# Patient Record
Sex: Female | Born: 1995 | Race: White | Hispanic: No | Marital: Single | Smoking: Former smoker
Health system: Southern US, Community
[De-identification: ages and names within clinical notes are randomized; demographics above are authoritative.]

## PROBLEM LIST (undated history)

## (undated) ENCOUNTER — Inpatient Hospital Stay (HOSPITAL_COMMUNITY): Payer: Self-pay

## (undated) DIAGNOSIS — D649 Anemia, unspecified: Secondary | ICD-10-CM

## (undated) DIAGNOSIS — R569 Unspecified convulsions: Secondary | ICD-10-CM

## (undated) DIAGNOSIS — J45909 Unspecified asthma, uncomplicated: Secondary | ICD-10-CM

## (undated) DIAGNOSIS — F419 Anxiety disorder, unspecified: Secondary | ICD-10-CM

## (undated) DIAGNOSIS — T7840XA Allergy, unspecified, initial encounter: Secondary | ICD-10-CM

## (undated) HISTORY — DX: Anemia, unspecified: D64.9

## (undated) HISTORY — PX: OTHER SURGICAL HISTORY: SHX169

## (undated) HISTORY — PX: APPENDECTOMY: SHX54

## (undated) HISTORY — PX: WISDOM TOOTH EXTRACTION: SHX21

## (undated) HISTORY — DX: Anxiety disorder, unspecified: F41.9

## (undated) HISTORY — DX: Allergy, unspecified, initial encounter: T78.40XA

---

## 2016-01-28 ENCOUNTER — Encounter (HOSPITAL_COMMUNITY): Payer: Self-pay | Admitting: Emergency Medicine

## 2016-01-28 ENCOUNTER — Encounter (HOSPITAL_COMMUNITY): Payer: Self-pay | Admitting: *Deleted

## 2016-01-28 ENCOUNTER — Emergency Department (HOSPITAL_COMMUNITY)
Admission: EM | Admit: 2016-01-28 | Discharge: 2016-01-28 | Disposition: A | Discharge status: Home / Self Care | Payer: BLUE CROSS/BLUE SHIELD | Attending: Emergency Medicine | Admitting: Emergency Medicine

## 2016-01-28 ENCOUNTER — Inpatient Hospital Stay (HOSPITAL_COMMUNITY): Payer: Self-pay

## 2016-01-28 ENCOUNTER — Inpatient Hospital Stay (HOSPITAL_COMMUNITY)
Admission: AD | Admit: 2016-01-28 | Discharge: 2016-01-28 | Disposition: A | Discharge status: Home / Self Care | Payer: Self-pay | Source: Ambulatory Visit | Attending: Obstetrics and Gynecology | Admitting: Obstetrics and Gynecology

## 2016-01-28 DIAGNOSIS — J45909 Unspecified asthma, uncomplicated: Secondary | ICD-10-CM | POA: Insufficient documentation

## 2016-01-28 DIAGNOSIS — F17200 Nicotine dependence, unspecified, uncomplicated: Secondary | ICD-10-CM | POA: Insufficient documentation

## 2016-01-28 DIAGNOSIS — R479 Unspecified speech disturbances: Secondary | ICD-10-CM | POA: Insufficient documentation

## 2016-01-28 DIAGNOSIS — Z3202 Encounter for pregnancy test, result negative: Secondary | ICD-10-CM | POA: Insufficient documentation

## 2016-01-28 DIAGNOSIS — R1032 Left lower quadrant pain: Secondary | ICD-10-CM | POA: Insufficient documentation

## 2016-01-28 DIAGNOSIS — R102 Pelvic and perineal pain: Secondary | ICD-10-CM | POA: Insufficient documentation

## 2016-01-28 DIAGNOSIS — R42 Dizziness and giddiness: Secondary | ICD-10-CM | POA: Insufficient documentation

## 2016-01-28 DIAGNOSIS — R569 Unspecified convulsions: Secondary | ICD-10-CM | POA: Insufficient documentation

## 2016-01-28 DIAGNOSIS — F445 Conversion disorder with seizures or convulsions: Secondary | ICD-10-CM

## 2016-01-28 HISTORY — DX: Unspecified convulsions: R56.9

## 2016-01-28 HISTORY — DX: Unspecified asthma, uncomplicated: J45.909

## 2016-01-28 LAB — I-STAT BETA HCG BLOOD, ED (MC, WL, AP ONLY)

## 2016-01-28 LAB — URINALYSIS, ROUTINE W REFLEX MICROSCOPIC
BILIRUBIN URINE: NEGATIVE
Glucose, UA: NEGATIVE mg/dL
KETONES UR: NEGATIVE mg/dL
Leukocytes, UA: NEGATIVE
Nitrite: NEGATIVE
PH: 7.5 (ref 5.0–8.0)
Protein, ur: NEGATIVE mg/dL
SPECIFIC GRAVITY, URINE: 1.01 (ref 1.005–1.030)

## 2016-01-28 LAB — CBC WITH DIFFERENTIAL/PLATELET
Basophils Absolute: 0 10*3/uL (ref 0.0–0.1)
Basophils Relative: 0 %
EOS ABS: 0.1 10*3/uL (ref 0.0–0.7)
EOS PCT: 1 %
HCT: 36.6 % (ref 36.0–46.0)
Hemoglobin: 12.7 g/dL (ref 12.0–15.0)
LYMPHS ABS: 2 10*3/uL (ref 0.7–4.0)
LYMPHS PCT: 27 %
MCH: 29.7 pg (ref 26.0–34.0)
MCHC: 34.7 g/dL (ref 30.0–36.0)
MCV: 85.5 fL (ref 78.0–100.0)
MONO ABS: 0.7 10*3/uL (ref 0.1–1.0)
MONOS PCT: 9 %
Neutro Abs: 4.6 10*3/uL (ref 1.7–7.7)
Neutrophils Relative %: 63 %
PLATELETS: 214 10*3/uL (ref 150–400)
RBC: 4.28 MIL/uL (ref 3.87–5.11)
RDW: 12.6 % (ref 11.5–15.5)
WBC: 7.4 10*3/uL (ref 4.0–10.5)

## 2016-01-28 LAB — RAPID URINE DRUG SCREEN, HOSP PERFORMED
Amphetamines: NOT DETECTED
BARBITURATES: NOT DETECTED
BENZODIAZEPINES: NOT DETECTED
Cocaine: NOT DETECTED
Opiates: NOT DETECTED
Tetrahydrocannabinol: NOT DETECTED

## 2016-01-28 LAB — BASIC METABOLIC PANEL
ANION GAP: 9 (ref 5–15)
BUN: 11 mg/dL (ref 6–20)
CALCIUM: 9.7 mg/dL (ref 8.9–10.3)
CO2: 25 mmol/L (ref 22–32)
Chloride: 106 mmol/L (ref 101–111)
Creatinine, Ser: 0.67 mg/dL (ref 0.44–1.00)
Glucose, Bld: 84 mg/dL (ref 65–99)
Potassium: 4 mmol/L (ref 3.5–5.1)
SODIUM: 140 mmol/L (ref 135–145)

## 2016-01-28 LAB — URINE MICROSCOPIC-ADD ON: WBC UA: NONE SEEN WBC/hpf (ref 0–5)

## 2016-01-28 LAB — GC/CHLAMYDIA PROBE AMP (~~LOC~~) NOT AT ARMC
Chlamydia: NEGATIVE
Neisseria Gonorrhea: NEGATIVE

## 2016-01-28 LAB — WET PREP, GENITAL
Clue Cells Wet Prep HPF POC: NONE SEEN
Sperm: NONE SEEN
Trich, Wet Prep: NONE SEEN
YEAST WET PREP: NONE SEEN

## 2016-01-28 LAB — POCT PREGNANCY, URINE: PREG TEST UR: NEGATIVE

## 2016-01-28 LAB — ETHANOL

## 2016-01-28 MED ORDER — KETOROLAC TROMETHAMINE 60 MG/2ML IM SOLN
60.0000 mg | Freq: Once | INTRAMUSCULAR | Status: AC
  Administered 2016-01-28: 60 mg via INTRAMUSCULAR
  Filled 2016-01-28: qty 2

## 2016-01-28 MED ORDER — SODIUM CHLORIDE 0.9 % IV BOLUS (SEPSIS): 1000.0000 mL | Freq: Once | INTRAVENOUS | Status: DC

## 2016-01-28 NOTE — Discharge Instructions (Signed)
Stay hydrated.   See your doctor.   Return to ER if you pass out, another seizure.

## 2016-01-28 NOTE — ED Notes (Signed)
Pt presents EMS after "pseudoseizure" this afternoon, recently dx with same.  EMS states that she was nonverbal en route but is now responding appropriately and moving all limbs independently without signs of post-ictal impairment.  Vitals WNL, a/o x 4, no acute distress.

## 2016-01-28 NOTE — Discharge Instructions (Signed)
OBGYN Providers [facility]    [facility]  [phone]     Phone: [phone]          [facility]                      [facility]  @[place]     Phone: [phone]  Phone: [phone]         [facility]     Phone: [phone]  Phone: [phone]           [facility] [place]                hone: [phone]  Phone: [phone]         [facility] [address] Francoise Ce[facility]ard [place]      Phone: [phone]  Phone: [phone]         [facility]                Phone: [phone]  [facility]   [phone]    [facility])          Phone: [phone] [facility]   Phone: [phone]

## 2016-01-28 NOTE — MAU Provider Note (Signed)
History     CSN: [id number]  Arrival date and time: 01/28/16 0100   First Provider Initiated Contact with Patient 01/28/16 0127      Chief Complaint  Patient presents with  . Abdominal Pain   Abdominal Pain This is a new problem. The current episode started today. The onset quality is sudden. The problem occurs constantly. The problem has been gradually worsening. The pain is located in the LLQ. The pain is at a severity of 8/10. The quality of the pain is sharp. The abdominal pain radiates to the back and pelvis (down leg). Associated symptoms include nausea. Pertinent negatives include no constipation, diarrhea, dysuria, fever, frequency or vomiting. The pain is aggravated by movement. The pain is relieved by nothing. Treatments tried: warm shower  The treatment provided no relief.    No past medical history on file.  No past surgical history on file.  No family history on file.  Social History  Substance Use Topics  . Smoking status: Not on file  . Smokeless tobacco: Not on file  . Alcohol Use: Not on file    Allergies: Allergies not on file  No prescriptions prior to admission    Review of Systems  Constitutional: Negative for fever and chills.  Gastrointestinal: Positive for nausea and abdominal pain. Negative for vomiting, diarrhea and constipation.  Genitourinary: Negative for dysuria, urgency and frequency.   Physical Exam   Blood pressure 125/75, pulse 105, temperature 98.3 F (36.8 C), temperature source Oral, resp. rate 18, last menstrual period 01/13/2016.  Physical Exam  Nursing note and vitals reviewed. Constitutional: She is oriented to person, place, and time. She appears well-developed and well-nourished. No distress.  HENT:  Head: Normocephalic.  Cardiovascular: Normal rate.   Respiratory: Effort normal.  GI: Soft. There is no tenderness. There is no rebound.  Neurological: She is alert and oriented to person, place, and time.  Skin: Skin is  warm and dry.  Psychiatric: She has a normal mood and affect.     Results for orders placed or performed during the hospital encounter of 01/28/16 (from the past 24 hour(s))  Urinalysis, Routine w reflex microscopic (not at [facility])     Status: Abnormal   Collection Time: 01/28/16  1:13 AM  Result Value Ref Range   Color, Urine YELLOW YELLOW   APPearance CLEAR CLEAR   Specific Gravity, Urine 1.010 1.005 - 1.030   pH 7.5 5.0 - 8.0   Glucose, UA NEGATIVE NEGATIVE mg/dL   Hgb urine dipstick TRACE (A) NEGATIVE   Bilirubin Urine NEGATIVE NEGATIVE   Ketones, ur NEGATIVE NEGATIVE mg/dL   Protein, ur NEGATIVE NEGATIVE mg/dL   Nitrite NEGATIVE NEGATIVE   Leukocytes, UA NEGATIVE NEGATIVE  Urine microscopic-add on     Status: Abnormal   Collection Time: 01/28/16  1:13 AM  Result Value Ref Range   Squamous Epithelial / LPF 0-5 (A) NONE SEEN   WBC, UA NONE SEEN 0 - 5 WBC/hpf   RBC / HPF 0-5 0 - 5 RBC/hpf   Bacteria, UA RARE (A) NONE SEEN  Pregnancy, urine POC     Status: None   Collection Time: 01/28/16  1:21 AM  Result Value Ref Range   Preg Test, Ur NEGATIVE NEGATIVE  Wet prep, genital     Status: Abnormal   Collection Time: 01/28/16  1:40 AM  Result Value Ref Range   Yeast Wet Prep HPF POC NONE SEEN NONE SEEN   Trich, Wet Prep NONE SEEN NONE SEEN  Clue Cells Wet Prep HPF POC NONE SEEN NONE SEEN   WBC, Wet Prep HPF POC MODERATE (A) NONE SEEN   Sperm NONE SEEN    [place] Transvaginal Non-ob  01/28/2016  CLINICAL DATA:  Left lower quadrant pain since 11 p.m. on 04/30. EXAM: TRANSABDOMINAL AND TRANSVAGINAL ULTRASOUND OF PELVIS TECHNIQUE: Both transabdominal and transvaginal ultrasound examinations of the pelvis were performed. Transabdominal technique was performed for global imaging of the pelvis including uterus, ovaries, adnexal regions, and pelvic cul-de-sac. It was necessary to proceed with endovaginal exam following the transabdominal exam to visualize the endometrium and ovaries.  COMPARISON:  None FINDINGS: Uterus Measurements: 6.8 x 2.7 x 4.4 cm. The uterus is anteverted. No fibroids or other mass visualized. Endometrium Thickness: 4.9 mm.  No focal abnormality visualized. Right ovary Measurements: 3.9 x 2.5 x 2.4 cm. Normal appearance/no adnexal mass. Left ovary Measurements: 2.8 x 1.7 x 1.5 cm. Normal appearance/no adnexal mass. Other findings No abnormal free fluid. IMPRESSION: Normal examination. Electronically Signed   By: Burman NievesWilliam  Stevens M.D.   On: 01/28/2016 02:31   [place] Pelvis Complete  01/28/2016  CLINICAL DATA:  Left lower quadrant pain since 11 p.m. on 04/30. EXAM: TRANSABDOMINAL AND TRANSVAGINAL ULTRASOUND OF PELVIS TECHNIQUE: Both transabdominal and transvaginal ultrasound examinations of the pelvis were performed. Transabdominal technique was performed for global imaging of the pelvis including uterus, ovaries, adnexal regions, and pelvic cul-de-sac. It was necessary to proceed with endovaginal exam following the transabdominal exam to visualize the endometrium and ovaries. COMPARISON:  None FINDINGS: Uterus Measurements: 6.8 x 2.7 x 4.4 cm. The uterus is anteverted. No fibroids or other mass visualized. Endometrium Thickness: 4.9 mm.  No focal abnormality visualized. Right ovary Measurements: 3.9 x 2.5 x 2.4 cm. Normal appearance/no adnexal mass. Left ovary Measurements: 2.8 x 1.7 x 1.5 cm. Normal appearance/no adnexal mass. Other findings No abnormal free fluid. IMPRESSION: Normal examination. Electronically Signed   By: Burman NievesWilliam  Stevens M.D.   On: 01/28/2016 02:31    MAU Course  Procedures  MDM Patient has had Toradol for pain. She reports improvement in her pain.   Assessment and Plan   1. Pain, pelvic, female    DC home Comfort measures reviewed  RX: none  Return to MAU as needed FU with OB as planned  Follow-up Information    Schedule an appointment as soon as possible [facility]   Contact information:   [address] [place] [id number] [phone]         Tawnya CrookHogan, Riyaan Heroux Donovan 01/28/2016, 1:28 AM

## 2016-01-28 NOTE — MAU Note (Signed)
Pt presents complaining of left lower abdominal pain that started tonight. H[facility] of [facility]. States she felt a bump on her ovary when she was checking to make sure both ovaries were ok. The pain radiates to back and down leg. Has implanon. Not pregnant. States she only has ibuprofen at home and didn't take it because she is immune to ibuprofen.

## 2016-01-28 NOTE — ED Provider Notes (Signed)
CSN: [id number]     Arrival date & time 01/28/16  1501 History   First MD Initiated Contact with Patient 01/28/16 1520     Chief Complaint  Patient presents with  . Seizures     (Consider location/radiation/quality/duration/timing/severity/associated sxs/prior Treatment) The history is provided by the patient.  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 She went outside for 5 minutes and came back in and saw the boyfriend. She states that she feels lightheaded and dizzy as if she is on pass out and boyfriend noticed that she was unable to talk. Denies any seizure activity. She was smoking outside at that time but denies any alcohol use or any drug use. She woke up in the ED and now is able to talk. [facility] and was told that she had pseudoseizures. She is not currently on meds.    Past Medical History  Diagnosis Date  . Asthma   . Seizures (HCC)     pseudoseizures   Past Surgical History  Procedure Laterality Date  . Appendectomy    . Ovarien    . Wisdom tooth extraction     No family history on file. Social History  Substance Use Topics  . Smoking status: Current Every Day Smoker  . Smokeless tobacco: None  . Alcohol Use: Yes     Comment: occasionally   OB History    Gravida Para Term Preterm AB TAB SAB Ectopic Multiple Living   1    1     0     Review of Systems  Neurological: Positive for speech difficulty and light-headedness.  All other systems reviewed and are negative.     Allergies  Coconut flavor  Home Medications   Prior to Admission medications   Medication Sig Start Date End Date Taking? Authorizing Provider  etonogestrel (IMPLANON) 68 MG IMPL implant 1 each by Subdermal route once.   Yes Historical Provider, MD  Ketorolac Tromethamine (TORADOL IJ) Inject as directed once.   Yes Historical Provider, MD   Pulse 75   Temp(Src) 98.6 F (37 C) (Oral)  Resp 18  SpO2 100%  LMP 01/13/2016 (Exact Date) Physical Exam  Constitutional: She is oriented to person, place, and time. She appears well-developed and well-nourished.  Calm, NAD, conversational   HENT:  Head: Normocephalic and atraumatic.  Mouth/Throat: Oropharynx is clear and moist.  Eyes: Conjunctivae are normal. Pupils are equal, round, and reactive to light.  Neck: Normal range of motion. Neck supple.  Cardiovascular: Normal rate, regular rhythm and normal heart sounds.   Pulmonary/Chest: Effort normal and breath sounds normal. No respiratory distress. She has no wheezes. She has no rales.  Abdominal: Soft. Bowel sounds are normal. She exhibits no distension. There is no tenderness. There is no rebound.  Musculoskeletal: Normal range of motion. She exhibits no edema or tenderness.  Neurological: She is alert and oriented to person, place, and time.  CN 2- 12 intact, nl strength throughout. Nl sensation. Nl finger to nose   Skin: Skin is warm and dry.  Psychiatric: She has a normal mood and affect. Her behavior is normal. Judgment and thought content normal.  Nursing note and vitals reviewed.   ED Course  Procedures (including critical care time) Labs Review Labs Reviewed  CBC WITH DIFFERENTIAL/PLATELET  ETHANOL  URINE RAPID DRUG SCREEN, HOSP PERFORMED  BASIC METABOLIC PANEL  I-STAT BETA HCG BLOOD, ED ([facility],  [facility], [facility] ONLY)    Imaging Review US Transvaginal Non-ob  01/28/2016  CLINICAL DATA:  Left lower quadrant pain since 11 p.m. on 04/30. EXAM: TRANSABDOMINAL AND TRANSVAGINAL ULTRASOUND OF PELVIS TECHNIQUE: Both transabdominal and transvaginal ultrasound examinations of the pelvis were performed. Transabdominal technique was performed for global imaging of the pelvis including uterus, ovaries, adnexal regions, and pelvic cul-de-sac. It was necessary to proceed with endovaginal exam following the transabdominal exam to visualize the endometrium  and ovaries. COMPARISON:  None FINDINGS: Uterus Measurements: 6.8 x 2.7 x 4.4 cm. The uterus is anteverted. No fibroids or other mass visualized. Endometrium Thickness: 4.9 mm.  No focal abnormality visualized. Right ovary Measurements: 3.9 x 2.5 x 2.4 cm. Normal appearance/no adnexal mass. Left ovary Measurements: 2.8 x 1.7 x 1.5 cm. Normal appearance/no adnexal mass. Other findings No abnormal free fluid. IMPRESSION: Normal examination. Electronically Signed   By: Burman Nieves M.D.   On: 01/28/2016 02:31   US Pelvis Complete  01/28/2016  CLINICAL DATA:  Left lower quadrant pain since 11 p.m. on 04/30. EXAM: TRANSABDOMINAL AND TRANSVAGINAL ULTRASOUND OF PELVIS TECHNIQUE: Both transabdominal and transvaginal ultrasound examinations of the pelvis were performed. Transabdominal technique was performed for global imaging of the pelvis including uterus, ovaries, adnexal regions, and pelvic cul-de-sac. It was necessary to proceed with endovaginal exam following the transabdominal exam to visualize the endometrium and ovaries. COMPARISON:  None FINDINGS: Uterus Measurements: 6.8 x 2.7 x 4.4 cm. The uterus is anteverted. No fibroids or other mass visualized. Endometrium Thickness: 4.9 mm.  No focal abnormality visualized. Right ovary Measurements: 3.9 x 2.5 x 2.4 cm. Normal appearance/no adnexal mass. Left ovary Measurements: 2.8 x 1.7 x 1.5 cm. Normal appearance/no adnexal mass. Other findings No abnormal free fluid. IMPRESSION: Normal examination. Electronically Signed   By: Burman Nieves M.D.   On: 01/28/2016 02:31   I have personally reviewed and evaluated these images and lab results as part of my medical decision-making.   EKG Interpretation   Date/Time:  Monday Jan 28 2016 15:39:07 EDT Ventricular Rate:  76 PR Interval:  121 QRS Duration: 93 QT Interval:  372 QTC Calculation: 418 R Axis:   58 Text Interpretation:  Sinus rhythm No significant change since last  tracing Confirmed by June Vacha  MD,  Adorian Gwynne ([id number]) on 01/28/2016 3:47:39 PM      MDM   Final diagnoses:  None   Malachi Suderman is a 20 y.o. female here with likely pseudoseizure vs near syncope. Will get labs, UDS, tox, EKG. Back to baseline and has confirmed pseudoseizure so will not need further workup.   4:36 PM Labs and UDS neg. Felt better. Not orthostatic. Will dc home.    Richardean Canal, MD 01/28/16 [phone]

## 2016-01-28 NOTE — ED Notes (Signed)
2 IV start attempts 

## 2016-01-28 NOTE — Progress Notes (Signed)
Patient listed as having BCBS insurance without a pcp.  EDCM spoke to patient at bedside.  Patient confirms she does not have a pcp.  [facility] with a list of pcps who accept BCBS insurance within a twenty mile radius of patient's zip code [phone].  Patient thankful for resources.  No further EDCM needs at this time.

## 2016-02-27 ENCOUNTER — Ambulatory Visit (INDEPENDENT_AMBULATORY_CARE_PROVIDER_SITE_OTHER): Payer: BLUE CROSS/BLUE SHIELD | Admitting: Urgent Care

## 2016-02-27 ENCOUNTER — Ambulatory Visit (INDEPENDENT_AMBULATORY_CARE_PROVIDER_SITE_OTHER): Payer: BLUE CROSS/BLUE SHIELD

## 2016-02-27 VITALS — BP 108/70 | HR 98 | Temp 98.4°F | Resp 18 | Wt 110.2 lb

## 2016-02-27 DIAGNOSIS — J209 Acute bronchitis, unspecified: Secondary | ICD-10-CM

## 2016-02-27 DIAGNOSIS — R0789 Other chest pain: Secondary | ICD-10-CM

## 2016-02-27 DIAGNOSIS — R05 Cough: Secondary | ICD-10-CM

## 2016-02-27 DIAGNOSIS — J4520 Mild intermittent asthma, uncomplicated: Secondary | ICD-10-CM | POA: Diagnosis not present

## 2016-02-27 DIAGNOSIS — R062 Wheezing: Secondary | ICD-10-CM

## 2016-02-27 DIAGNOSIS — R059 Cough, unspecified: Secondary | ICD-10-CM

## 2016-02-27 LAB — POCT CBC
GRANULOCYTE PERCENT: 64.3 % (ref 37–80)
HCT, POC: 36.5 % — AB (ref 37.7–47.9)
Hemoglobin: 12.9 g/dL (ref 12.2–16.2)
LYMPH, POC: 2.3 (ref 0.6–3.4)
MCH: 31.2 pg (ref 27–31.2)
MCHC: 35.3 g/dL (ref 31.8–35.4)
MCV: 88.5 fL (ref 80–97)
MID (CBC): 0.9 (ref 0–0.9)
MPV: 7.4 fL (ref 0–99.8)
PLATELET COUNT, POC: 212 10*3/uL (ref 142–424)
POC Granulocyte: 5.9 (ref 2–6.9)
POC LYMPH PERCENT: 25.3 %L (ref 10–50)
POC MID %: 10.4 %M (ref 0–12)
RBC: 4.13 M/uL (ref 4.04–5.48)
RDW, POC: 12.8 %
WBC: 9.1 10*3/uL (ref 4.6–10.2)

## 2016-02-27 MED ORDER — IPRATROPIUM BROMIDE 0.02 % IN SOLN
0.5000 mg | Freq: Once | RESPIRATORY_TRACT | Status: AC
  Administered 2016-02-27: 0.5 mg via RESPIRATORY_TRACT

## 2016-02-27 MED ORDER — ALBUTEROL SULFATE (2.5 MG/3ML) 0.083% IN NEBU
2.5000 mg | INHALATION_SOLUTION | Freq: Once | RESPIRATORY_TRACT | Status: AC
  Administered 2016-02-27: 2.5 mg via RESPIRATORY_TRACT

## 2016-02-27 MED ORDER — BENZONATATE 100 MG PO CAPS: 100.0000 mg | ORAL_CAPSULE | Freq: Three times a day (TID) | ORAL | Status: DC | PRN

## 2016-02-27 MED ORDER — HYDROCODONE-HOMATROPINE 5-1.5 MG/5ML PO SYRP: 5.0000 mL | ORAL_SOLUTION | Freq: Every evening | ORAL | Status: DC | PRN

## 2016-02-27 MED ORDER — MONTELUKAST SODIUM 10 MG PO TABS: 10.0000 mg | ORAL_TABLET | Freq: Every day | ORAL | Status: DC

## 2016-02-27 NOTE — Patient Instructions (Addendum)
Cough, Adult Coughing is a reflex that clears your throat and your airways. Coughing helps to heal and protect your lungs. It is normal to cough occasionally, but a cough that happens with other symptoms or lasts a long time may be a sign of a condition that needs treatment. A cough may last only 2-3 weeks (acute), or it may last longer than 8 weeks (chronic). CAUSES Coughing is commonly caused by:  Breathing in substances that irritate your lungs.  A viral or bacterial respiratory infection.  Allergies.  Asthma.  Postnasal drip.  Smoking.  Acid backing up from the stomach into the esophagus (gastroesophageal reflux).  Certain medicines.  Chronic lung problems, including COPD (or rarely, lung cancer).  Other medical conditions such as heart failure. HOME CARE INSTRUCTIONS  Pay attention to any changes in your symptoms. Take these actions to help with your discomfort:  Take medicines only as told by your health care provider.  If you were prescribed an antibiotic medicine, take it as told by your health care provider. Do not stop taking the antibiotic even if you start to feel better.  Talk with your health care provider before you take a cough suppressant medicine.  Drink enough fluid to keep your urine clear or pale yellow.  If the air is dry, use a cold steam vaporizer or humidifier in your bedroom or your home to help loosen secretions.  Avoid anything that causes you to cough at work or at home.  If your cough is worse at night, try sleeping in a semi-upright position.  Avoid cigarette smoke. If you smoke, quit smoking. If you need help quitting, ask your health care provider.  Avoid caffeine.  Avoid alcohol.  Rest as needed. SEEK MEDICAL CARE IF:   You have new symptoms.  You cough up pus.  Your cough does not get better after 2-3 weeks, or your cough gets worse.  You cannot control your cough with suppressant medicines and you are losing sleep.  You  develop pain that is getting worse or pain that is not controlled with pain medicines.  You have a fever.  You have unexplained weight loss.  You have night sweats. SEEK IMMEDIATE MEDICAL CARE IF:  You cough up blood.  You have difficulty breathing.  Your heartbeat is very fast.   This information is not intended to replace advice given to you by your health care provider. Make sure you discuss any questions you have with your health care provider.   Document Released: 03/14/2011 Document Revised: 06/06/2015 Document Reviewed: 11/22/2014 Elsevier Interactive Patient Education 2016 Elsevier Inc.     IF you received an x-ray today, you will receive an invoice from [facility]. Please contact [facility] at [phone] with questions or concerns regarding your invoice.   IF you received labwork today, you will receive an invoice from [facility]. Please contact [facility] at [phone] with questions or concerns regarding your invoice.   Our billing staff will not be able to assist you with questions regarding bills from these companies.  You will be contacted with the lab results as soon as they are available. The fastest way to get your results is to activate your My Chart account. Instructions are located on the last page of this paperwork. If you have not heard from us regarding the results in 2 weeks, please contact this office.      

## 2016-02-27 NOTE — Progress Notes (Signed)
MRN: [id number] DOB: 09/07/1996  Subjective:   Alice Price is a 20 y.o. female with pmh of asthma presenting for chief complaint of Bronchitis  Reports 4 day history of persistent mildly productive cough, chills, malaise, sore throat, stuffy nose. Cough elicits chest pain, shob, wheezing. Last night, patient had post-tussive emesis. [facility], started on Azithromycin and cough syrup. She received nebulizer treatment with mild improvement. Denies fever, sinus pain. Smokes 1/2 ppd, still smoking. She is working on cutting back.   Denny Peonrin has a current medication list which includes the following prescription(s): albuterol, azithromycin, brompheniramine-pseudoephedrine-dm, and etonogestrel. Also is allergic to coconut flavor.  Denny Peonrin  has a past medical history of Asthma; Seizures (HCC); Allergy; Anemia; and Anxiety. Also  has past surgical history that includes Appendectomy; ovarien; and Wisdom tooth extraction.  Her family history includes Hyperlipidemia in her father; Hypertension in her father; Mental illness in her mother.   Objective:   Vitals: BP 108/70 mmHg  Pulse 98  Temp(Src) 98.4 F (36.9 C) (Oral)  Resp 18  Wt 110 lb 3.2 oz (49.986 kg)  SpO2 98%  LMP 02/17/2016  Physical Exam  Constitutional: She is oriented to person, place, and time. She appears well-developed and well-nourished.  HENT:  TM's intact bilaterally, no effusions or erythema. Nasal turbinates pink and moist, nasal passages patent. No sinus tenderness. Oropharynx clear, mucous membranes moist, dentition in good repair.  Eyes: Right eye exhibits no discharge. Left eye exhibits no discharge. No scleral icterus.  Neck: Normal range of motion. Neck supple.  Cardiovascular: Normal rate, regular rhythm and intact distal pulses.  Exam reveals no gallop and no friction rub.   No murmur heard. Pulmonary/Chest: No respiratory distress. She has no wheezes. She has no rales.    Shallow breathing which may reflect poor inspiratory effort.  Musculoskeletal: She exhibits no edema.  Lymphadenopathy:    She has no cervical adenopathy.  Neurological: She is alert and oriented to person, place, and time.  Skin: Skin is warm and dry.   Dg Chest 2 View  02/27/2016  CLINICAL DATA:  History of asthma, bronchitis, 4 day history of cough with chills and sore throat EXAM: CHEST  2 VIEW COMPARISON:  None. FINDINGS: No active infiltrate or effusion is seen. Mediastinal and hilar contours are unremarkable. The heart is within normal limits in size. No bony abnormality is seen. IMPRESSION: No active cardiopulmonary disease. Electronically Signed   By: Dwyane DeePaul  Barry M.D.   On: 02/27/2016 14:23   Results for orders placed or performed in visit on 02/27/16 (from the past 24 hour(s))  POCT CBC     Status: Abnormal   Collection Time: 02/27/16  1:45 PM  Result Value Ref Range   WBC 9.1 4.6 - 10.2 K/uL   Lymph, poc 2.3 0.6 - 3.4   POC LYMPH PERCENT 25.3 10 - 50 %L   MID (cbc) 0.9 0 - 0.9   POC MID % 10.4 0 - 12 %M   POC Granulocyte 5.9 2 - 6.9   Granulocyte percent 64.3 37 - 80 %G   RBC 4.13 4.04 - 5.48 M/uL   Hemoglobin 12.9 12.2 - 16.2 g/dL   HCT, POC 62.936.5 (A) 52.837.7 - 47.9 %   MCV 88.5 80 - 97 fL   MCH, POC 31.2 27 - 31.2 pg   MCHC 35.3 31.8 - 35.4 g/dL   RDW, POC 41.312.8 %   Platelet Count, POC 212 142 - 424 K/uL  MPV 7.4 0 - 99.8 fL   Assessment and Plan :   1. Acute bronchitis, unspecified organism 2. Cough 3. Atypical chest pain 4. Extrinsic asthma, mild intermittent, uncomplicated - Continue azithromycin. Start Singulair, [place], [place]. Schedule albuterol. RTC in 1 week if no improvement.  Wallis Bamberg, PA-C [facility] [phone] 02/27/2016 1:26 PM

## 2016-07-01 ENCOUNTER — Encounter (HOSPITAL_COMMUNITY): Payer: Self-pay | Admitting: Emergency Medicine

## 2016-07-01 ENCOUNTER — Emergency Department (HOSPITAL_COMMUNITY)
Admission: EM | Admit: 2016-07-01 | Discharge: 2016-07-01 | Disposition: A | Discharge status: Home / Self Care | Payer: Medicaid Other | Attending: Emergency Medicine | Admitting: Emergency Medicine

## 2016-07-01 DIAGNOSIS — J45909 Unspecified asthma, uncomplicated: Secondary | ICD-10-CM | POA: Insufficient documentation

## 2016-07-01 DIAGNOSIS — O210 Mild hyperemesis gravidarum: Secondary | ICD-10-CM | POA: Insufficient documentation

## 2016-07-01 DIAGNOSIS — O0281 Inappropriate change in quantitative human chorionic gonadotropin (hCG) in early pregnancy: Secondary | ICD-10-CM | POA: Diagnosis not present

## 2016-07-01 DIAGNOSIS — F17200 Nicotine dependence, unspecified, uncomplicated: Secondary | ICD-10-CM | POA: Diagnosis not present

## 2016-07-01 DIAGNOSIS — Z3A00 Weeks of gestation of pregnancy not specified: Secondary | ICD-10-CM | POA: Insufficient documentation

## 2016-07-01 DIAGNOSIS — O99331 Smoking (tobacco) complicating pregnancy, first trimester: Secondary | ICD-10-CM | POA: Insufficient documentation

## 2016-07-01 DIAGNOSIS — O26891 Other specified pregnancy related conditions, first trimester: Secondary | ICD-10-CM | POA: Diagnosis present

## 2016-07-01 DIAGNOSIS — Z3490 Encounter for supervision of normal pregnancy, unspecified, unspecified trimester: Secondary | ICD-10-CM

## 2016-07-01 LAB — URINALYSIS, ROUTINE W REFLEX MICROSCOPIC
Bilirubin Urine: NEGATIVE
Glucose, UA: NEGATIVE mg/dL
Ketones, ur: NEGATIVE mg/dL
LEUKOCYTES UA: NEGATIVE
Nitrite: NEGATIVE
PH: 6 (ref 5.0–8.0)
PROTEIN: NEGATIVE mg/dL
SPECIFIC GRAVITY, URINE: 1.013 (ref 1.005–1.030)

## 2016-07-01 LAB — COMPREHENSIVE METABOLIC PANEL
ALK PHOS: 60 U/L (ref 38–126)
ALT: 14 U/L (ref 14–54)
ANION GAP: 10 (ref 5–15)
AST: 20 U/L (ref 15–41)
Albumin: 4.4 g/dL (ref 3.5–5.0)
BILIRUBIN TOTAL: 0.5 mg/dL (ref 0.3–1.2)
BUN: 7 mg/dL (ref 6–20)
CALCIUM: 9.5 mg/dL (ref 8.9–10.3)
CO2: 22 mmol/L (ref 22–32)
CREATININE: 0.65 mg/dL (ref 0.44–1.00)
Chloride: 102 mmol/L (ref 101–111)
Glucose, Bld: 86 mg/dL (ref 65–99)
Potassium: 3.8 mmol/L (ref 3.5–5.1)
SODIUM: 134 mmol/L — AB (ref 135–145)
TOTAL PROTEIN: 7.7 g/dL (ref 6.5–8.1)

## 2016-07-01 LAB — CBC
HCT: 37.7 % (ref 36.0–46.0)
HEMOGLOBIN: 13.2 g/dL (ref 12.0–15.0)
MCH: 30.5 pg (ref 26.0–34.0)
MCHC: 35 g/dL (ref 30.0–36.0)
MCV: 87.1 fL (ref 78.0–100.0)
PLATELETS: 217 10*3/uL (ref 150–400)
RBC: 4.33 MIL/uL (ref 3.87–5.11)
RDW: 11.7 % (ref 11.5–15.5)
WBC: 6 10*3/uL (ref 4.0–10.5)

## 2016-07-01 LAB — URINE MICROSCOPIC-ADD ON: Bacteria, UA: NONE SEEN

## 2016-07-01 LAB — I-STAT BETA HCG BLOOD, ED (MC, WL, AP ONLY): I-stat hCG, quantitative: 65.8 m[IU]/mL — ABNORMAL HIGH (ref <5)

## 2016-07-01 LAB — LIPASE, BLOOD: Lipase: 25 U/L (ref 11–51)

## 2016-07-01 MED ORDER — PROMETHAZINE HCL 25 MG RE SUPP: 25.0000 mg | Freq: Four times a day (QID) | RECTAL | 0 refills | Status: DC | PRN

## 2016-07-01 MED ORDER — FAMOTIDINE IN NACL 20-0.9 MG/50ML-% IV SOLN
20.0000 mg | Freq: Once | INTRAVENOUS | Status: AC
  Administered 2016-07-01: 20 mg via INTRAVENOUS
  Filled 2016-07-01: qty 50

## 2016-07-01 MED ORDER — ONDANSETRON 4 MG PO TBDP
ORAL_TABLET | ORAL | Status: AC
  Filled 2016-07-01: qty 1

## 2016-07-01 MED ORDER — PROMETHAZINE HCL 25 MG/ML IJ SOLN
25.0000 mg | Freq: Once | INTRAMUSCULAR | Status: AC
  Administered 2016-07-01: 25 mg via INTRAVENOUS
  Filled 2016-07-01: qty 1

## 2016-07-01 MED ORDER — ONDANSETRON HCL 4 MG/2ML IJ SOLN
4.0000 mg | Freq: Once | INTRAMUSCULAR | Status: AC
  Administered 2016-07-01: 4 mg via INTRAVENOUS
  Filled 2016-07-01: qty 2

## 2016-07-01 MED ORDER — ONDANSETRON 4 MG PO TBDP: 4.0000 mg | ORAL_TABLET | Freq: Once | ORAL | Status: DC | PRN

## 2016-07-01 MED ORDER — SODIUM CHLORIDE 0.9 % IV BOLUS (SEPSIS)
1000.0000 mL | Freq: Once | INTRAVENOUS | Status: AC
  Administered 2016-07-01: 1000 mL via INTRAVENOUS

## 2016-07-01 NOTE — ED Triage Notes (Signed)
Pt states "i went to my doctor yesterday and I had colitis and gasterentiris and upper respiratory infection. This morning I coughed up blood clots. They told me to come here. I'm throwing up everything every 30 minutes. They gave me zofran but I cant keep anything down". Pt c/o abdominal pain. 5/10. Denies diarrhea. Pt appears well.

## 2016-07-01 NOTE — ED Provider Notes (Signed)
[facility]-EMERGENCY DEPT Provider Note   CSN: [id number] Arrival date & time: 07/01/16  1801     History   Chief Complaint Chief Complaint  Patient presents with  . Abdominal Pain    HPI Alice Price is a 20 y.o. female.  Pt presents to the ED today with n/v and abdominal pain.  The pt said that she has seen some blood in her emesis.  The pt said that she saw her doctor yesterday and was put on zofran, but she was unable to keep that down.  The pt said that she removed her implant for birth control a few months ago and has been trying to get pregnant.        Past Medical History:  Diagnosis Date  . Allergy   . Anemia   . Anxiety   . Asthma   . Seizures (HCC)    pseudoseizures    There are no active problems to display for this patient.   Past Surgical History:  Procedure Laterality Date  . APPENDECTOMY    . ovarien    . WISDOM TOOTH EXTRACTION      OB History    Gravida Para Term Preterm AB Living   1       1 0   SAB TAB Ectopic Multiple Live Births                   Home Medications    Prior to Admission medications   Medication Sig Start Date End Date Taking? Authorizing Provider  albuterol (PROVENTIL HFA;VENTOLIN HFA) 108 (90 Base) MCG/ACT inhaler Inhale into the lungs every 6 (six) hours as needed for wheezing or shortness of breath.   Yes Historical Provider, MD  benzonatate (TESSALON) 100 MG capsule Take 1-2 capsules (100-200 mg total) by mouth 3 (three) times daily as needed for cough. Patient not taking: Reported on 07/01/2016 02/27/16   Wallis Bamberg, PA-C  HYDROcodone-homatropine [facility]) 5-1.5 MG/5ML syrup Take 5 mLs by mouth at bedtime as needed. Patient not taking: Reported on 07/01/2016 02/27/16   Wallis Bamberg, PA-C  montelukast (SINGULAIR) 10 MG tablet Take 1 tablet (10 mg total) by mouth at bedtime. Patient not taking: Reported on 07/01/2016 02/27/16   Wallis Bamberg, PA-C  promethazine (PHENERGAN) 25 MG suppository Place 1 suppository (25 mg total)  rectally every 6 (six) hours as needed for nausea or vomiting. 07/01/16   Jacalyn Lefevre, MD    Family History Family History  Problem Relation Age of Onset  . Mental illness Mother   . Hyperlipidemia Father   . Hypertension Father     Social History Social History  Substance Use Topics  . Smoking status: Current Every Day Smoker  . Smokeless tobacco: Not on file  . Alcohol use Yes     Comment: occasionally     Allergies   Coconut flavor [flavoring agent]   Review of Systems Review of Systems  Gastrointestinal: Positive for abdominal pain, nausea and vomiting.  All other systems reviewed and are negative.    Physical Exam Updated Vital Signs BP 112/66   Pulse 98   Temp 98.4 F (36.9 C)   Resp 18   LMP 06/10/2016   SpO2 99%   Physical Exam  Constitutional: She is oriented to person, place, and time. She appears well-developed and well-nourished.  HENT:  Head: Normocephalic and atraumatic.  Right Ear: External ear normal.  Left Ear: External ear normal.  Nose: Nose normal.  Mouth/Throat: Mucous membranes are dry.  Eyes: Conjunctivae  and EOM are normal. Pupils are equal, round, and reactive to light.  Neck: Normal range of motion. Neck supple.  Cardiovascular: Normal rate, regular rhythm, normal heart sounds and intact distal pulses.   Pulmonary/Chest: Effort normal and breath sounds normal.  Abdominal: Soft. Bowel sounds are normal.  Musculoskeletal: Normal range of motion.  Neurological: She is alert and oriented to person, place, and time.  Skin: Skin is warm.  Psychiatric: She has a normal mood and affect. Her behavior is normal. Judgment and thought content normal.  Nursing note and vitals reviewed.    ED Treatments / Results  Labs (all labs ordered are listed, but only abnormal results are displayed) Labs Reviewed  COMPREHENSIVE METABOLIC PANEL - Abnormal; Notable for the following:       Result Value   Sodium 134 (*)    All other components  within normal limits  URINALYSIS, ROUTINE W REFLEX MICROSCOPIC (NOT AT [facility]) - Abnormal; Notable for the following:    Hgb urine dipstick TRACE (*)    All other components within normal limits  URINE MICROSCOPIC-ADD ON - Abnormal; Notable for the following:    Squamous Epithelial / LPF 0-5 (*)    All other components within normal limits  I-STAT BETA HCG BLOOD, ED ([facility], [facility], [facility] ONLY) - Abnormal; Notable for the following:    I-stat hCG, quantitative 65.8 (*)    All other components within normal limits  LIPASE, BLOOD  CBC    EKG  EKG Interpretation None       Radiology No results found.  Procedures Procedures (including critical care time)  Medications Ordered in ED Medications  ondansetron (ZOFRAN-ODT) disintegrating tablet 4 mg (not administered)  ondansetron (ZOFRAN-ODT) 4 MG disintegrating tablet (not administered)  sodium chloride 0.9 % bolus 1,000 mL (0 mLs Intravenous Stopped 07/01/16 2249)  promethazine (PHENERGAN) injection 25 mg (25 mg Intravenous Given 07/01/16 2136)  famotidine (PEPCID) IVPB 20 mg premix (0 mg Intravenous Stopped 07/01/16 2211)  ondansetron (ZOFRAN) injection 4 mg (4 mg Intravenous Given 07/01/16 2226)     Initial Impression / Assessment and Plan / ED Course  I have reviewed the triage vital signs and the nursing notes.  Pertinent labs & imaging results that were available during my care of the patient were reviewed by me and considered in my medical decision making (see chart for details).  Clinical Course    Pt is feeling better.  She is able to tolerate po fluids.  [facility] and rx for phenergan suppositories.  She knows to return if worse.  Final Clinical Impressions(s) / ED Diagnoses   Final diagnoses:  Hyperemesis gravidarum  Pregnancy at early stage    New Prescriptions New Prescriptions   PROMETHAZINE (PHENERGAN) 25 MG SUPPOSITORY    Place 1 suppository (25 mg total)  rectally every 6 (six) hours as needed for nausea or vomiting.     Jacalyn LefevreJulie Brittanya Winburn, MD 07/01/16 2303

## 2016-07-01 NOTE — ED Notes (Addendum)
Pt drinking Ginger Ale at this time, tolerating well at this time.

## 2016-07-01 NOTE — ED Notes (Signed)
Pt d/c home  

## 2016-07-30 ENCOUNTER — Inpatient Hospital Stay (HOSPITAL_COMMUNITY)
Admission: AD | Admit: 2016-07-30 | Discharge: 2016-07-30 | Disposition: A | Discharge status: Home / Self Care | Payer: Medicaid Other | Source: Ambulatory Visit | Attending: Obstetrics & Gynecology | Admitting: Obstetrics & Gynecology

## 2016-07-30 ENCOUNTER — Inpatient Hospital Stay (HOSPITAL_COMMUNITY): Payer: Medicaid Other

## 2016-07-30 ENCOUNTER — Encounter (HOSPITAL_COMMUNITY): Payer: Self-pay | Admitting: *Deleted

## 2016-07-30 DIAGNOSIS — O200 Threatened abortion: Secondary | ICD-10-CM | POA: Diagnosis not present

## 2016-07-30 DIAGNOSIS — R102 Pelvic and perineal pain: Secondary | ICD-10-CM | POA: Diagnosis present

## 2016-07-30 DIAGNOSIS — Z87891 Personal history of nicotine dependence: Secondary | ICD-10-CM | POA: Insufficient documentation

## 2016-07-30 DIAGNOSIS — Z3A01 Less than 8 weeks gestation of pregnancy: Secondary | ICD-10-CM | POA: Diagnosis not present

## 2016-07-30 LAB — URINALYSIS, ROUTINE W REFLEX MICROSCOPIC
Bilirubin Urine: NEGATIVE
GLUCOSE, UA: NEGATIVE mg/dL
KETONES UR: NEGATIVE mg/dL
LEUKOCYTES UA: NEGATIVE
NITRITE: NEGATIVE
PH: 5.5 (ref 5.0–8.0)
Protein, ur: NEGATIVE mg/dL

## 2016-07-30 LAB — WET PREP, GENITAL
CLUE CELLS WET PREP: NONE SEEN
Sperm: NONE SEEN
TRICH WET PREP: NONE SEEN
YEAST WET PREP: NONE SEEN

## 2016-07-30 LAB — URINE MICROSCOPIC-ADD ON

## 2016-07-30 LAB — CBC
HCT: 33.2 % — ABNORMAL LOW (ref 36.0–46.0)
HEMOGLOBIN: 11.7 g/dL — AB (ref 12.0–15.0)
MCH: 30.2 pg (ref 26.0–34.0)
MCHC: 35.2 g/dL (ref 30.0–36.0)
MCV: 85.8 fL (ref 78.0–100.0)
PLATELETS: 235 10*3/uL (ref 150–400)
RBC: 3.87 MIL/uL (ref 3.87–5.11)
RDW: 12.4 % (ref 11.5–15.5)
WBC: 6.4 10*3/uL (ref 4.0–10.5)

## 2016-07-30 LAB — HCG, QUANTITATIVE, PREGNANCY: HCG, BETA CHAIN, QUANT, S: 4774 m[IU]/mL — AB (ref <5)

## 2016-07-30 NOTE — Discharge Instructions (Signed)

## 2016-07-30 NOTE — MAU Provider Note (Signed)
History     CSN: [id number]  Arrival date and time: 07/30/16 4098   First Provider Initiated Contact with Patient 07/30/16 2044      Chief Complaint  Patient presents with  . Pelvic Pain   Pelvic Pain  The patient's primary symptoms include pelvic pain. This is a new problem. The current episode started today (around 1815). The problem has been gradually improving. Pain severity now: 2/10  The problem affects both sides. She is pregnant. Associated symptoms include abdominal pain and nausea. Pertinent negatives include no chills, dysuria, fever (felt hot, but doesnt have a thermometer ), frequency, urgency or vomiting. The vaginal discharge was mucoid. There has been no bleeding. Nothing aggravates the symptoms. She has tried nothing for the symptoms. Menstrual history: LMP 06/01/16    Past Medical History:  Diagnosis Date  . Allergy   . Anemia   . Anxiety   . Asthma   . Seizures (HCC)    pseudoseizures    Past Surgical History:  Procedure Laterality Date  . APPENDECTOMY    . ovarien    . WISDOM TOOTH EXTRACTION      Family History  Problem Relation Age of Onset  . Mental illness Mother   . Hyperlipidemia Father   . Hypertension Father     Social History  Substance Use Topics  . Smoking status: Former Smoker    Quit date: 07/01/2016  . Smokeless tobacco: Never Used  . Alcohol use Yes     Comment: occasionally    Allergies:  Allergies  Allergen Reactions  . Coconut Flavor [Flavoring Agent] Swelling    Prescriptions Prior to Admission  Medication Sig Dispense Refill Last Dose  . albuterol (PROVENTIL HFA;VENTOLIN HFA) 108 (90 Base) MCG/ACT inhaler Inhale into the lungs every 6 (six) hours as needed for wheezing or shortness of breath.   07/01/2016 at Unknown time  . benzonatate (TESSALON) 100 MG capsule Take 1-2 capsules (100-200 mg total) by mouth 3 (three) times daily as needed for cough. (Patient not taking: Reported on 07/01/2016) 40 capsule 0 Not Taking at  Unknown time  . HYDROcodone-homatropine (HYCODAN) 5-1.5 MG/5ML syrup Take 5 mLs by mouth at bedtime as needed. (Patient not taking: Reported on 07/01/2016) 120 mL 0 Not Taking at Unknown time  . montelukast (SINGULAIR) 10 MG tablet Take 1 tablet (10 mg total) by mouth at bedtime. (Patient not taking: Reported on 07/01/2016) 30 tablet 3 Not Taking at Unknown time  . promethazine (PHENERGAN) 25 MG suppository Place 1 suppository (25 mg total) rectally every 6 (six) hours as needed for nausea or vomiting. 12 each 0     Review of Systems  Constitutional: Negative for chills and fever (felt hot, but doesnt have a thermometer ).  Gastrointestinal: Positive for abdominal pain and nausea. Negative for vomiting.  Genitourinary: Positive for pelvic pain. Negative for dysuria, frequency and urgency.   Physical Exam   Blood pressure 110/58, pulse 90, temperature 98.6 F (37 C), temperature source Oral, resp. rate 16, height 4\' 11"  (1.499 m), weight 114 lb (51.7 kg), last menstrual period 06/01/2016, SpO2 99 %.  Physical Exam  Nursing note and vitals reviewed. Constitutional: She is oriented to person, place, and time. She appears well-developed and well-nourished. No distress.  HENT:  Head: Normocephalic.  Cardiovascular: Normal rate.   Respiratory: Effort normal.  GI: Soft. There is no tenderness. There is no rebound.  Genitourinary:  Genitourinary Comments:  External: no lesion Vagina: small amount of tan discharge Cervix: pink, smooth, no  CMT Uterus: NSSC Adnexa: NT   Neurological: She is alert and oriented to person, place, and time.  Skin: Skin is warm and dry.  Psychiatric: She has a normal mood and affect.   Results for orders placed or performed during the hospital encounter of 07/30/16 (from the past 24 hour(s))  Urinalysis, Routine w reflex microscopic (not at [facility])     Status: Abnormal   Collection Time: 07/30/16  7:50 PM  Result Value Ref Range   Color, Urine YELLOW YELLOW    APPearance CLEAR CLEAR   Specific Gravity, Urine <1.005 (L) 1.005 - 1.030   pH 5.5 5.0 - 8.0   Glucose, UA NEGATIVE NEGATIVE mg/dL   Hgb urine dipstick SMALL (A) NEGATIVE   Bilirubin Urine NEGATIVE NEGATIVE   Ketones, ur NEGATIVE NEGATIVE mg/dL   Protein, ur NEGATIVE NEGATIVE mg/dL   Nitrite NEGATIVE NEGATIVE   Leukocytes, UA NEGATIVE NEGATIVE  Urine microscopic-add on     Status: Abnormal   Collection Time: 07/30/16  7:50 PM  Result Value Ref Range   Squamous Epithelial / LPF 0-5 (A) NONE SEEN   WBC, UA 0-5 0 - 5 WBC/hpf   RBC / HPF 0-5 0 - 5 RBC/hpf   Bacteria, UA RARE (A) NONE SEEN  Wet prep, genital     Status: Abnormal   Collection Time: 07/30/16  8:51 PM  Result Value Ref Range   Yeast Wet Prep HPF POC NONE SEEN NONE SEEN   Trich, Wet Prep NONE SEEN NONE SEEN   Clue Cells Wet Prep HPF POC NONE SEEN NONE SEEN   WBC, Wet Prep HPF POC MODERATE (A) NONE SEEN   Sperm NONE SEEN   CBC     Status: Abnormal   Collection Time: 07/30/16  9:03 PM  Result Value Ref Range   WBC 6.4 4.0 - 10.5 K/uL   RBC 3.87 3.87 - 5.11 MIL/uL   Hemoglobin 11.7 (L) 12.0 - 15.0 g/dL   HCT 78.233.2 (L) 95.636.0 - 21.346.0 %   MCV 85.8 78.0 - 100.0 fL   MCH 30.2 26.0 - 34.0 pg   MCHC 35.2 30.0 - 36.0 g/dL   RDW 08.612.4 57.811.5 - 46.915.5 %   Platelets 235 150 - 400 K/uL  ABO/Rh     Status: None (Preliminary result)   Collection Time: 07/30/16  9:03 PM  Result Value Ref Range   ABO/RH(D) O POS    [place] Ob Comp Less 14 Wks  Result Date: 07/30/2016 CLINICAL DATA:  Acute onset of pelvic pain.  Initial encounter. EXAM: OBSTETRIC <14 WK US AND TRANSVAGINAL OB US TECHNIQUE: Both transabdominal and transvaginal ultrasound examinations were performed for complete evaluation of the gestation as well as the maternal uterus, adnexal regions, and pelvic cul-de-sac. Transvaginal technique was performed to assess early pregnancy. COMPARISON:  Pelvic ultrasound performed 01/28/2016 FINDINGS: Intrauterine gestational sac: Single; visualized  and normal in shape. Yolk sac:  Yes Embryo:  Yes Cardiac Activity: No CRL:  4.1 mm   6 w   1 d                  US EDC: 03/24/2017 Subchorionic hemorrhage:  None visualized. Maternal uterus/adnexae: The uterus is otherwise unremarkable. The ovaries are within normal limits. The right ovary measures 3.4 x 2.0 x 3.0 cm, while the left ovary measures 2.6 x 2.4 x 1.5 cm. No suspicious adnexal masses are seen; there is no evidence for ovarian torsion. No free fluid is seen within the pelvic cul-de-sac. IMPRESSION: Single  intrauterine pregnancy noted, with a crown-rump length of 4 mm, corresponding to a gestational age of [redacted] weeks 1 day. This does not match the gestational age by LMP, reflecting an estimated date of delivery of March 24, 2017. No cardiac activity is seen at this time. This may still remain within normal limits, given the size of the embryo. If quantitative beta HCG level continues to rise, follow-up pelvic ultrasound could be performed in 2 weeks for further evaluation. Electronically Signed   By: Roanna RaiderJeffery  Chang M.D.   On: 07/30/2016 21:36   [place] Ob Transvaginal  Result Date: 07/30/2016 CLINICAL DATA:  Acute onset of pelvic pain.  Initial encounter. EXAM: OBSTETRIC <14 WK US AND TRANSVAGINAL OB US TECHNIQUE: Both transabdominal and transvaginal ultrasound examinations were performed for complete evaluation of the gestation as well as the maternal uterus, adnexal regions, and pelvic cul-de-sac. Transvaginal technique was performed to assess early pregnancy. COMPARISON:  Pelvic ultrasound performed 01/28/2016 FINDINGS: Intrauterine gestational sac: Single; visualized and normal in shape. Yolk sac:  Yes Embryo:  Yes Cardiac Activity: No CRL:  4.1 mm   6 w   1 d                  US EDC: 03/24/2017 Subchorionic hemorrhage:  None visualized. Maternal uterus/adnexae: The uterus is otherwise unremarkable. The ovaries are within normal limits. The right ovary measures 3.4 x 2.0 x 3.0 cm, while the left ovary  measures 2.6 x 2.4 x 1.5 cm. No suspicious adnexal masses are seen; there is no evidence for ovarian torsion. No free fluid is seen within the pelvic cul-de-sac. IMPRESSION: Single intrauterine pregnancy noted, with a crown-rump length of 4 mm, corresponding to a gestational age of [redacted] weeks 1 day. This does not match the gestational age by LMP, reflecting an estimated date of delivery of March 24, 2017. No cardiac activity is seen at this time. This may still remain within normal limits, given the size of the embryo. If quantitative beta HCG level continues to rise, follow-up pelvic ultrasound could be performed in 2 weeks for further evaluation. Electronically Signed   By: Roanna RaiderJeffery  Chang M.D.   On: 07/30/2016 21:36    MAU Course  Procedures  MDM   Assessment and Plan   1. Threatened abortion in first trimester    DC home Comfort measures reviewed  1st Trimester precautions  Bleeding precautions Ectopic precautions RX: none  Return to MAU as needed FU with OB as planned  Follow-up Information    BATES, STEFANIE A, CNM. Call today.   Specialty:  Certified Nurse Midwife Why:  Call for a follow up appointment  Contact information: [id number] [place] [phone]            Tawnya CrookHogan, Heather Donovan 07/30/2016, 8:45 PM

## 2016-07-30 NOTE — MAU Note (Signed)
Pt reports she called her MD earlier because she felt like she may be running a fever. States she has not felt good all day and has just been in bed,. Reports a sharp pain that started in her lower abd and radiated up to her shoulder and down her right arm. States the pain went away but her arm aches now.

## 2016-07-31 LAB — RPR: RPR: NONREACTIVE

## 2016-07-31 LAB — GC/CHLAMYDIA PROBE AMP (~~LOC~~) NOT AT ARMC
CHLAMYDIA, DNA PROBE: NEGATIVE
Neisseria Gonorrhea: NEGATIVE

## 2016-07-31 LAB — ABO/RH: ABO/RH(D): O POS

## 2016-07-31 LAB — HIV ANTIBODY (ROUTINE TESTING W REFLEX): HIV Screen 4th Generation wRfx: NONREACTIVE

## 2016-08-05 ENCOUNTER — Inpatient Hospital Stay (HOSPITAL_COMMUNITY)
Admission: AD | Admit: 2016-08-05 | Discharge: 2016-08-05 | Disposition: A | Discharge status: Home / Self Care | Payer: Medicaid Other | Source: Ambulatory Visit | Attending: Family Medicine | Admitting: Family Medicine

## 2016-08-05 ENCOUNTER — Inpatient Hospital Stay (EMERGENCY_DEPARTMENT_HOSPITAL)
Admission: AD | Admit: 2016-08-05 | Discharge: 2016-08-06 | Disposition: A | Discharge status: Home / Self Care | Payer: Medicaid Other | Source: Ambulatory Visit | Attending: Family Medicine | Admitting: Family Medicine

## 2016-08-05 ENCOUNTER — Encounter (HOSPITAL_COMMUNITY): Payer: Self-pay | Admitting: *Deleted

## 2016-08-05 ENCOUNTER — Encounter: Payer: Self-pay | Admitting: Certified Nurse Midwife

## 2016-08-05 DIAGNOSIS — F419 Anxiety disorder, unspecified: Secondary | ICD-10-CM | POA: Insufficient documentation

## 2016-08-05 DIAGNOSIS — O99511 Diseases of the respiratory system complicating pregnancy, first trimester: Secondary | ICD-10-CM | POA: Insufficient documentation

## 2016-08-05 DIAGNOSIS — O99341 Other mental disorders complicating pregnancy, first trimester: Secondary | ICD-10-CM

## 2016-08-05 DIAGNOSIS — O039 Complete or unspecified spontaneous abortion without complication: Secondary | ICD-10-CM | POA: Diagnosis not present

## 2016-08-05 DIAGNOSIS — R102 Pelvic and perineal pain: Secondary | ICD-10-CM | POA: Diagnosis present

## 2016-08-05 DIAGNOSIS — O99011 Anemia complicating pregnancy, first trimester: Secondary | ICD-10-CM | POA: Diagnosis not present

## 2016-08-05 DIAGNOSIS — Z3A09 9 weeks gestation of pregnancy: Secondary | ICD-10-CM

## 2016-08-05 DIAGNOSIS — Z87891 Personal history of nicotine dependence: Secondary | ICD-10-CM | POA: Insufficient documentation

## 2016-08-05 DIAGNOSIS — O021 Missed abortion: Secondary | ICD-10-CM | POA: Insufficient documentation

## 2016-08-05 DIAGNOSIS — O26891 Other specified pregnancy related conditions, first trimester: Secondary | ICD-10-CM | POA: Diagnosis present

## 2016-08-05 LAB — CBC
HCT: 32.6 % — ABNORMAL LOW (ref 36.0–46.0)
Hemoglobin: 11.6 g/dL — ABNORMAL LOW (ref 12.0–15.0)
MCH: 30.3 pg (ref 26.0–34.0)
MCHC: 35.6 g/dL (ref 30.0–36.0)
MCV: 85.1 fL (ref 78.0–100.0)
PLATELETS: 224 10*3/uL (ref 150–400)
RBC: 3.83 MIL/uL — ABNORMAL LOW (ref 3.87–5.11)
RDW: 12.3 % (ref 11.5–15.5)
WBC: 8.4 10*3/uL (ref 4.0–10.5)

## 2016-08-05 LAB — HCG, QUANTITATIVE, PREGNANCY: HCG, BETA CHAIN, QUANT, S: 3285 m[IU]/mL — AB (ref <5)

## 2016-08-05 MED ORDER — HYDROCODONE-ACETAMINOPHEN 5-325 MG PO TABS: 1.0000 | ORAL_TABLET | ORAL | 0 refills | Status: DC | PRN

## 2016-08-05 MED ORDER — OXYCODONE-ACETAMINOPHEN 5-325 MG PO TABS
2.0000 | ORAL_TABLET | Freq: Once | ORAL | Status: AC
  Administered 2016-08-05: 2 via ORAL
  Filled 2016-08-05: qty 2

## 2016-08-05 NOTE — Discharge Instructions (Signed)
Miscarriage  A miscarriage is the sudden loss of an unborn baby (fetus) before the 20th week of pregnancy. Most miscarriages happen in the first 3 months of pregnancy. Sometimes, it happens before a woman even knows she is pregnant. A miscarriage is also called a "spontaneous miscarriage" or "early pregnancy loss." Having a miscarriage can be an emotional experience. Talk with your caregiver about any questions you may have about miscarrying, the grieving process, and your future pregnancy plans.  CAUSES    Problems with the fetal chromosomes that make it impossible for the baby to develop normally. Problems with the baby's genes or chromosomes are most often the result of errors that occur, by chance, as the embryo divides and grows. The problems are not inherited from the parents.   Infection of the cervix or uterus.    Hormone problems.    Problems with the cervix, such as having an incompetent cervix. This is when the tissue in the cervix is not strong enough to hold the pregnancy.    Problems with the uterus, such as an abnormally shaped uterus, uterine fibroids, or congenital abnormalities.    Certain medical conditions.    Smoking, drinking alcohol, or taking illegal drugs.    Trauma.   Often, the cause of a miscarriage is unknown.   SYMPTOMS    Vaginal bleeding or spotting, with or without cramps or pain.   Pain or cramping in the abdomen or lower back.   Passing fluid, tissue, or blood clots from the vagina.  DIAGNOSIS   Your caregiver will perform a physical exam. You may also have an ultrasound to confirm the miscarriage. Blood or urine tests may also be ordered.  TREATMENT    Sometimes, treatment is not necessary if you naturally pass all the fetal tissue that was in the uterus. If some of the fetus or placenta remains in the body (incomplete miscarriage), tissue left behind may become infected and must be removed. Usually, a dilation and curettage (D and C) procedure is performed.  During a D and C procedure, the cervix is widened (dilated) and any remaining fetal or placental tissue is gently removed from the uterus.   Antibiotic medicines are prescribed if there is an infection. Other medicines may be given to reduce the size of the uterus (contract) if there is a lot of bleeding.   If you have Rh negative blood and your baby was Rh positive, you will need a Rh immunoglobulin shot. This shot will protect any future baby from having Rh blood problems in future pregnancies.  HOME CARE INSTRUCTIONS    Your caregiver may order bed rest or may allow you to continue light activity. Resume activity as directed by your caregiver.   Have someone help with home and family responsibilities during this time.    Keep track of the number of sanitary pads you use each day and how soaked (saturated) they are. Write down this information.    Do not use tampons. Do not douche or have sexual intercourse until approved by your caregiver.    Only take over-the-counter or prescription medicines for pain or discomfort as directed by your caregiver.    Do not take aspirin. Aspirin can cause bleeding.    Keep all follow-up appointments with your caregiver.    If you or your partner have problems with grieving, talk to your caregiver or seek counseling to help cope with the pregnancy loss. Allow enough time to grieve before trying to get pregnant again.     SEEK IMMEDIATE MEDICAL CARE IF:    You have severe cramps or pain in your back or abdomen.   You have a fever.   You pass large blood clots (walnut-sized or larger) ortissue from your vagina. Save any tissue for your caregiver to inspect.    Your bleeding increases.    You have a thick, bad-smelling vaginal discharge.   You become lightheaded, weak, or you faint.    You have chills.   MAKE SURE YOU:   Understand these instructions.   Will watch your condition.   Will get help right away if you are not doing well or get worse.     This  information is not intended to replace advice given to you by your health care provider. Make sure you discuss any questions you have with your health care provider.     Document Released: 03/11/2001 Document Revised: 01/10/2013 Document Reviewed: 11/04/2011  Elsevier Interactive Patient Education 2016 [employer]

## 2016-08-05 NOTE — MAU Note (Signed)
PT  SAYS SHE WAS HERE ON 11-1  AND  YESTERDAY.    GAVE HER PAIN MED LAST NIGHT   AND    HAS MORE  VAG  BLEEDING    AND M ORE   PAIN.  IN TRIAGE-  PAD  ON    THIN PANTY  LINER   - LARGE  AMT  RED  BLOOD.

## 2016-08-05 NOTE — Progress Notes (Signed)
Discharge instructions given with pt understanding. Paper Px given with instruction to hand it over to pharmacist. Pt lift unit via ambulatory.

## 2016-08-05 NOTE — MAU Note (Signed)
Pt reports increase in bleeding since 6pm. Pt called her doctor because her pain was getting worse. Pt states that her doctor wanted her to "come here for an ultrasound"

## 2016-08-05 NOTE — MAU Provider Note (Signed)
History     CSN: [id number]  Arrival date and time: 08/05/16 0221   First Provider Initiated Contact with Patient 08/05/16 0241      Chief Complaint  Patient presents with  . Pelvic Pain   Alice Price is a 20 y.o. G2P0010 at 2846w2d who presents today with pelvic pain. She was seen here on 07/30/16 and had US that showed small fetal pole with no cardiac activity. Patient advised that miscarriage likely. FU with her office, and they stated that it was a miscarriage. Told to take ibuprofen. Ibuprofen is not helping. States that she has had bleeding, but it has been within what she expected for a miscarriage.    Pelvic Pain  The patient's primary symptoms include pelvic pain and vaginal bleeding. This is a new problem. The current episode started in the past 7 days. The problem occurs intermittently. The problem has been gradually worsening. Pain severity now: 6/10. The problem affects both sides. She is pregnant. Associated symptoms include abdominal pain. Pertinent negatives include no chills, constipation, diarrhea, dysuria, fever, frequency, nausea, urgency or vomiting. The vaginal discharge was bloody. The vaginal bleeding is heavier than menses. She has been passing clots. She has been passing tissue. Nothing aggravates the symptoms. She has tried NSAIDs for the symptoms. The treatment provided no relief. Menstrual history: LMP 06/01/16      Past Medical History:  Diagnosis Date  . Allergy   . Anemia   . Anxiety   . Asthma   . Seizures (HCC)    pseudoseizures    Past Surgical History:  Procedure Laterality Date  . APPENDECTOMY    . ovarien    . WISDOM TOOTH EXTRACTION      Family History  Problem Relation Age of Onset  . Mental illness Mother   . Hyperlipidemia Father   . Hypertension Father     Social History  Substance Use Topics  . Smoking status: Former Smoker    Quit date: 07/01/2016  . Smokeless tobacco: Never Used  . Alcohol use Yes     Comment: occasionally     Allergies:  Allergies  Allergen Reactions  . Coconut Flavor [Flavoring Agent] Swelling    Prescriptions Prior to Admission  Medication Sig Dispense Refill Last Dose  . albuterol (PROVENTIL HFA;VENTOLIN HFA) 108 (90 Base) MCG/ACT inhaler Inhale into the lungs every 6 (six) hours as needed for wheezing or shortness of breath.   08/05/2016 at Unknown time  . promethazine (PHENERGAN) 25 MG suppository Place 1 suppository (25 mg total) rectally every 6 (six) hours as needed for nausea or vomiting. 12 each 0 Past Month at Unknown time  . benzonatate (TESSALON) 100 MG capsule Take 1-2 capsules (100-200 mg total) by mouth 3 (three) times daily as needed for cough. (Patient not taking: Reported on 08/05/2016) 40 capsule 0 More than a month at Unknown time  . HYDROcodone-homatropine (HYCODAN) 5-1.5 MG/5ML syrup Take 5 mLs by mouth at bedtime as needed. (Patient not taking: Reported on 08/05/2016) 120 mL 0 More than a month at Unknown time  . montelukast (SINGULAIR) 10 MG tablet Take 1 tablet (10 mg total) by mouth at bedtime. (Patient not taking: Reported on 08/05/2016) 30 tablet 3 More than a month at Unknown time    Review of Systems  Constitutional: Negative for chills and fever.  Gastrointestinal: Positive for abdominal pain. Negative for constipation, diarrhea, nausea and vomiting.  Genitourinary: Positive for pelvic pain. Negative for dysuria, frequency and urgency.   Physical Exam  Last menstrual period 06/01/2016.  Physical Exam  Nursing note and vitals reviewed. Constitutional: She is oriented to person, place, and time. She appears well-developed and well-nourished. No distress.  HENT:  Head: Normocephalic.  Cardiovascular: Normal rate.   Respiratory: Effort normal.  GI: Soft. There is no tenderness. There is no rebound.  Neurological: She is alert and oriented to person, place, and time.  Skin: Skin is warm and dry.  Psychiatric: She has a normal mood and affect.  Results for  Alice QuietFRITTS, Courtenay (MRN [id number]) as of 08/05/2016 03:59  Ref. Range 07/30/2016 21:03 07/30/2016 21:30 08/05/2016 02:56 08/05/2016 02:58  HCG, Beta Chain, Quant, S Latest Ref Range: <5 mIU/mL 4,774 (H)   3,285 (H)    Results for orders placed or performed during the hospital encounter of 08/05/16 (from the past 24 hour(s))  CBC     Status: Abnormal   Collection Time: 08/05/16  2:56 AM  Result Value Ref Range   WBC 8.4 4.0 - 10.5 K/uL   RBC 3.83 (L) 3.87 - 5.11 MIL/uL   Hemoglobin 11.6 (L) 12.0 - 15.0 g/dL   HCT 40.932.6 (L) 81.136.0 - 91.446.0 %   MCV 85.1 78.0 - 100.0 fL   MCH 30.3 26.0 - 34.0 pg   MCHC 35.6 30.0 - 36.0 g/dL   RDW 78.212.3 95.611.5 - 21.315.5 %   Platelets 224 150 - 400 K/uL  hCG, quantitative, pregnancy     Status: Abnormal   Collection Time: 08/05/16  2:58 AM  Result Value Ref Range   hCG, Beta Chain, Quant, S 3,285 (H) <5 mIU/mL     MAU Course  Procedures  MDM   Assessment and Plan   1. Spontaneous abortion in first trimester    DC home Comfort measures reviewed  Bleeding precautions RX: vicodin PRN #[id number]  Return to MAU as needed FU with OB as planned  Follow-up Information    BATES, STEFANIE A, CNM Follow up.   Specialty:  Certified Nurse Midwife Contact information: [address] [place] [phone]            Tawnya CrookHogan, Yolandra Habig Donovan 08/05/2016, 2:47 AM

## 2016-08-05 NOTE — MAU Note (Signed)
Pt had a +upt on Nov 1st and her MD informed her that the baby had died the next day.  The bleeding started yesterday and is sm to moderate now; the pt reports the MD gave her ibuprofen for pain.  She has been taking it every 6 hrs with out relief. She is desiring some better pain management .

## 2016-08-06 ENCOUNTER — Inpatient Hospital Stay (HOSPITAL_COMMUNITY): Payer: Medicaid Other

## 2016-08-06 DIAGNOSIS — O039 Complete or unspecified spontaneous abortion without complication: Secondary | ICD-10-CM

## 2016-08-06 LAB — CBC
HCT: 32.3 % — ABNORMAL LOW (ref 36.0–46.0)
HEMOGLOBIN: 11.4 g/dL — AB (ref 12.0–15.0)
MCH: 30.9 pg (ref 26.0–34.0)
MCHC: 35.3 g/dL (ref 30.0–36.0)
MCV: 87.5 fL (ref 78.0–100.0)
PLATELETS: 202 10*3/uL (ref 150–400)
RBC: 3.69 MIL/uL — AB (ref 3.87–5.11)
RDW: 12.2 % (ref 11.5–15.5)
WBC: 11.2 10*3/uL — AB (ref 4.0–10.5)

## 2016-08-06 MED ORDER — KETOROLAC TROMETHAMINE 60 MG/2ML IM SOLN
60.0000 mg | Freq: Once | INTRAMUSCULAR | Status: AC
  Administered 2016-08-06: 60 mg via INTRAMUSCULAR
  Filled 2016-08-06: qty 2

## 2016-08-06 NOTE — MAU Provider Note (Signed)
History     CSN: [id number]  Arrival date and time: 08/05/16 2308   First Provider Initiated Contact with Patient 08/06/16 0021      Chief Complaint  Patient presents with  . Pelvic Pain   Pelvic Pain  The patient's primary symptoms include pelvic pain and vaginal bleeding. This is a new problem. The current episode started in the past 7 days. The problem occurs intermittently. The problem has been gradually worsening. Pain severity now: 8/10  The problem affects both sides. She is pregnant. Associated symptoms include abdominal pain, nausea and vomiting. Pertinent negatives include no chills, diarrhea or fever. The vaginal bleeding is heavier than menses. She has been passing clots (mucousy clots. ). Nothing aggravates the symptoms. Treatments tried: Vicodin  The treatment provided no relief.     Past Medical History:  Diagnosis Date  . Allergy   . Anemia   . Anxiety   . Asthma   . Seizures (HCC)    pseudoseizures    Past Surgical History:  Procedure Laterality Date  . APPENDECTOMY    . ovarien    . WISDOM TOOTH EXTRACTION      Family History  Problem Relation Age of Onset  . Mental illness Mother   . Hyperlipidemia Father   . Hypertension Father     Social History  Substance Use Topics  . Smoking status: Former Smoker    Quit date: 07/01/2016  . Smokeless tobacco: Never Used  . Alcohol use Yes     Comment: occasionally    Allergies:  Allergies  Allergen Reactions  . Coconut Flavor [Flavoring Agent] Swelling    Prescriptions Prior to Admission  Medication Sig Dispense Refill Last Dose  . HYDROcodone-acetaminophen (NORCO/VICODIN) 5-325 MG tablet Take 1-2 tablets by mouth every 4 (four) hours as needed. 20 tablet 0 08/05/2016 at 1600  . ibuprofen (ADVIL,MOTRIN) 800 MG tablet Take 800 mg by mouth every 8 (eight) hours as needed.   08/04/2016 at Unknown time  . albuterol (PROVENTIL HFA;VENTOLIN HFA) 108 (90 Base) MCG/ACT inhaler Inhale into the lungs every 6  (six) hours as needed for wheezing or shortness of breath.   08/05/2016 at Unknown time  . benzonatate (TESSALON) 100 MG capsule Take 1-2 capsules (100-200 mg total) by mouth 3 (three) times daily as needed for cough. (Patient not taking: Reported on 08/05/2016) 40 capsule 0 More than a month at Unknown time  . HYDROcodone-homatropine (HYCODAN) 5-1.5 MG/5ML syrup Take 5 mLs by mouth at bedtime as needed. (Patient not taking: Reported on 08/05/2016) 120 mL 0 More than a month at Unknown time  . montelukast (SINGULAIR) 10 MG tablet Take 1 tablet (10 mg total) by mouth at bedtime. (Patient not taking: Reported on 08/05/2016) 30 tablet 3 More than a month at Unknown time  . promethazine (PHENERGAN) 25 MG suppository Place 1 suppository (25 mg total) rectally every 6 (six) hours as needed for nausea or vomiting. 12 each 0 Past Month at Unknown time    Review of Systems  Constitutional: Negative for chills and fever.  Gastrointestinal: Positive for abdominal pain, nausea and vomiting. Negative for diarrhea.  Genitourinary: Positive for pelvic pain.  Neurological: Positive for dizziness.   Physical Exam   Blood pressure 108/71, pulse 97, temperature 98.4 F (36.9 C), temperature source Oral, resp. rate 18, height 4\' 11"  (1.499 m), weight 114 lb (51.7 kg), last menstrual period 06/01/2016.  Physical Exam  Nursing note and vitals reviewed. Constitutional: She is oriented to person, place, and time. She  appears well-developed and well-nourished. No distress.  HENT:  Head: Normocephalic.  Cardiovascular: Normal rate.   Respiratory: Effort normal.  GI: Soft. There is tenderness (all over tenderness ). There is no rebound.  Genitourinary:  Genitourinary Comments: Scant amount of blood on pad   Neurological: She is alert and oriented to person, place, and time.  Skin: Skin is warm and dry.  Psychiatric: She has a normal mood and affect.   Results for orders placed or performed during the hospital  encounter of 08/05/16 (from the past 24 hour(s))  CBC     Status: Abnormal   Collection Time: 08/06/16 12:30 AM  Result Value Ref Range   WBC 11.2 (H) 4.0 - 10.5 K/uL   RBC 3.69 (L) 3.87 - 5.11 MIL/uL   Hemoglobin 11.4 (L) 12.0 - 15.0 g/dL   HCT 40.932.3 (L) 81.136.0 - 91.446.0 %   MCV 87.5 78.0 - 100.0 fL   MCH 30.9 26.0 - 34.0 pg   MCHC 35.3 30.0 - 36.0 g/dL   RDW 78.212.2 95.611.5 - 21.315.5 %   Platelets 202 150 - 400 K/uL   [place] Ob Transvaginal  Result Date: 08/06/2016 CLINICAL DATA:  Missed abortion. Pain. Increased vaginal bleeding. Quantitative beta HCG 3,285. Estimated gestational age by LMP is 9 weeks 3 days. EXAM: TRANSVAGINAL OB ULTRASOUND TECHNIQUE: Transvaginal ultrasound was performed for complete evaluation of the gestation as well as the maternal uterus, adnexal regions, and pelvic cul-de-sac. COMPARISON:  07/30/2016 FINDINGS: Intrauterine gestational sac: No intrauterine gestational sac identified. Yolk sac:  Not identified. Embryo:  Not identified. Cardiac Activity: None identified. Maternal uterus/adnexae: No myometrial mass lesions identified. Endometrium appears thickened and heterogeneous with mixed echotexture material within the endometrium. Endometrial thickness measures about 1.2 cm. No flow is demonstrated within the endometrial contents on color flow Doppler imaging. Both ovaries are visualized and appear normal. No abnormal adnexal masses. Small amount of free fluid in the pelvis. IMPRESSION: No intrauterine gestational sac or fetal pole seen today, indicating interval loss of the pregnancy seen on previous study. Blood clots demonstrated within the endometrium without evidence of retained products of conception. Findings meet definitive criteria for failed pregnancy. This follows SRU consensus guidelines: Diagnostic Criteria for Nonviable Pregnancy Early in the First Trimester. Macy Mis Engl J Med [phone];369:1443-51. Electronically Signed   By: Burman NievesWilliam  Stevens M.D.   On: 08/06/2016 01:20   MAU Course   Procedures  MDM   Assessment and Plan   1. Abortion, spontaneous    DC home Comfort measures reviewed  Bleeding precautions RX: continue current meds. Recommend taking ibuprofen on a schedule with narcotic as needed for breakthrough pain.  Return to MAU as needed FU with OB as planned  Follow-up Information    BATES, STEFANIE A, CNM Follow up.   Specialty:  Certified Nurse Midwife Contact information: [address] [place] [phone]            Tawnya CrookHogan, Alice Spicer Donovan 08/06/2016, 12:23 AM

## 2016-08-06 NOTE — Discharge Instructions (Signed)
Miscarriage  A miscarriage is the sudden loss of an unborn baby (fetus) before the 20th week of pregnancy. Most miscarriages happen in the first 3 months of pregnancy. Sometimes, it happens before a woman even knows she is pregnant. A miscarriage is also called a "spontaneous miscarriage" or "early pregnancy loss." Having a miscarriage can be an emotional experience. Talk with your caregiver about any questions you may have about miscarrying, the grieving process, and your future pregnancy plans.  CAUSES    Problems with the fetal chromosomes that make it impossible for the baby to develop normally. Problems with the baby's genes or chromosomes are most often the result of errors that occur, by chance, as the embryo divides and grows. The problems are not inherited from the parents.   Infection of the cervix or uterus.    Hormone problems.    Problems with the cervix, such as having an incompetent cervix. This is when the tissue in the cervix is not strong enough to hold the pregnancy.    Problems with the uterus, such as an abnormally shaped uterus, uterine fibroids, or congenital abnormalities.    Certain medical conditions.    Smoking, drinking alcohol, or taking illegal drugs.    Trauma.   Often, the cause of a miscarriage is unknown.   SYMPTOMS    Vaginal bleeding or spotting, with or without cramps or pain.   Pain or cramping in the abdomen or lower back.   Passing fluid, tissue, or blood clots from the vagina.  DIAGNOSIS   Your caregiver will perform a physical exam. You may also have an ultrasound to confirm the miscarriage. Blood or urine tests may also be ordered.  TREATMENT    Sometimes, treatment is not necessary if you naturally pass all the fetal tissue that was in the uterus. If some of the fetus or placenta remains in the body (incomplete miscarriage), tissue left behind may become infected and must be removed. Usually, a dilation and curettage (D and C) procedure is performed.  During a D and C procedure, the cervix is widened (dilated) and any remaining fetal or placental tissue is gently removed from the uterus.   Antibiotic medicines are prescribed if there is an infection. Other medicines may be given to reduce the size of the uterus (contract) if there is a lot of bleeding.   If you have Rh negative blood and your baby was Rh positive, you will need a Rh immunoglobulin shot. This shot will protect any future baby from having Rh blood problems in future pregnancies.  HOME CARE INSTRUCTIONS    Your caregiver may order bed rest or may allow you to continue light activity. Resume activity as directed by your caregiver.   Have someone help with home and family responsibilities during this time.    Keep track of the number of sanitary pads you use each day and how soaked (saturated) they are. Write down this information.    Do not use tampons. Do not douche or have sexual intercourse until approved by your caregiver.    Only take over-the-counter or prescription medicines for pain or discomfort as directed by your caregiver.    Do not take aspirin. Aspirin can cause bleeding.    Keep all follow-up appointments with your caregiver.    If you or your partner have problems with grieving, talk to your caregiver or seek counseling to help cope with the pregnancy loss. Allow enough time to grieve before trying to get pregnant again.     SEEK IMMEDIATE MEDICAL CARE IF:    You have severe cramps or pain in your back or abdomen.   You have a fever.   You pass large blood clots (walnut-sized or larger) ortissue from your vagina. Save any tissue for your caregiver to inspect.    Your bleeding increases.    You have a thick, bad-smelling vaginal discharge.   You become lightheaded, weak, or you faint.    You have chills.   MAKE SURE YOU:   Understand these instructions.   Will watch your condition.   Will get help right away if you are not doing well or get worse.     This  information is not intended to replace advice given to you by your health care provider. Make sure you discuss any questions you have with your health care provider.     Document Released: 03/11/2001 Document Revised: 01/10/2013 Document Reviewed: 11/04/2011  Elsevier Interactive Patient Education 2016 [employer]

## 2016-10-06 ENCOUNTER — Encounter (HOSPITAL_COMMUNITY): Payer: Self-pay | Admitting: Emergency Medicine

## 2016-10-06 DIAGNOSIS — Z87891 Personal history of nicotine dependence: Secondary | ICD-10-CM | POA: Diagnosis not present

## 2016-10-06 DIAGNOSIS — G43809 Other migraine, not intractable, without status migrainosus: Secondary | ICD-10-CM | POA: Diagnosis not present

## 2016-10-06 DIAGNOSIS — F410 Panic disorder [episodic paroxysmal anxiety] without agoraphobia: Secondary | ICD-10-CM | POA: Diagnosis not present

## 2016-10-06 DIAGNOSIS — J45909 Unspecified asthma, uncomplicated: Secondary | ICD-10-CM | POA: Diagnosis not present

## 2016-10-06 DIAGNOSIS — Z79899 Other long term (current) drug therapy: Secondary | ICD-10-CM | POA: Insufficient documentation

## 2016-10-06 DIAGNOSIS — R110 Nausea: Secondary | ICD-10-CM | POA: Diagnosis present

## 2016-10-06 LAB — I-STAT BETA HCG BLOOD, ED (MC, WL, AP ONLY)

## 2016-10-06 LAB — CBC
HCT: 38.2 % (ref 36.0–46.0)
Hemoglobin: 13.5 g/dL (ref 12.0–15.0)
MCH: 30.3 pg (ref 26.0–34.0)
MCHC: 35.3 g/dL (ref 30.0–36.0)
MCV: 85.7 fL (ref 78.0–100.0)
PLATELETS: 306 10*3/uL (ref 150–400)
RBC: 4.46 MIL/uL (ref 3.87–5.11)
RDW: 11.7 % (ref 11.5–15.5)
WBC: 10.3 10*3/uL (ref 4.0–10.5)

## 2016-10-06 LAB — BASIC METABOLIC PANEL
Anion gap: 10 (ref 5–15)
BUN: 8 mg/dL (ref 6–20)
CALCIUM: 10 mg/dL (ref 8.9–10.3)
CO2: 23 mmol/L (ref 22–32)
CREATININE: 0.74 mg/dL (ref 0.44–1.00)
Chloride: 104 mmol/L (ref 101–111)
Glucose, Bld: 121 mg/dL — ABNORMAL HIGH (ref 65–99)
Potassium: 3.3 mmol/L — ABNORMAL LOW (ref 3.5–5.1)
SODIUM: 137 mmol/L (ref 135–145)

## 2016-10-06 MED ORDER — ALBUTEROL SULFATE (2.5 MG/3ML) 0.083% IN NEBU
INHALATION_SOLUTION | RESPIRATORY_TRACT | Status: AC
  Filled 2016-10-06: qty 6

## 2016-10-06 MED ORDER — ALBUTEROL SULFATE (2.5 MG/3ML) 0.083% IN NEBU
5.0000 mg | INHALATION_SOLUTION | Freq: Once | RESPIRATORY_TRACT | Status: AC
  Administered 2016-10-06: 5 mg via RESPIRATORY_TRACT

## 2016-10-06 NOTE — ED Triage Notes (Signed)
Pt presents to ED for assessment of left sided headache x 4 days.  Pt sts pain radiates from left eye back and across forehead.  Pt sts two episodes of syncope this evening with a hx of syncope.  Pt c/o dizziness with standing.  Pt also c/o "feeling tight" with a hx of asthma and pt does not have her inhaler.  Requesting a breathing treatment.

## 2016-10-06 NOTE — ED Notes (Signed)
Pt's boyfriend came to triage and asked for RN to come in room.  Pt was not verbally responsive, but responded to sternal rub.  Pt's right hand shaking.  Pt's head tilted back.  Pt placed on monitor with HR of 145 noted.  Pt breathing shallow.  MD made aware, will order blood work and EKG and continue to monitor pt.

## 2016-10-07 ENCOUNTER — Emergency Department (HOSPITAL_COMMUNITY)
Admission: EM | Admit: 2016-10-07 | Discharge: 2016-10-07 | Disposition: A | Discharge status: Home / Self Care | Payer: Medicaid Other | Attending: Emergency Medicine | Admitting: Emergency Medicine

## 2016-10-07 ENCOUNTER — Emergency Department (HOSPITAL_COMMUNITY): Payer: Medicaid Other

## 2016-10-07 DIAGNOSIS — G43809 Other migraine, not intractable, without status migrainosus: Secondary | ICD-10-CM

## 2016-10-07 DIAGNOSIS — F410 Panic disorder [episodic paroxysmal anxiety] without agoraphobia: Secondary | ICD-10-CM

## 2016-10-07 MED ORDER — ALBUTEROL SULFATE HFA 108 (90 BASE) MCG/ACT IN AERS: 2.0000 | INHALATION_SPRAY | Freq: Four times a day (QID) | RESPIRATORY_TRACT | 3 refills | Status: DC | PRN

## 2016-10-07 MED ORDER — DEXAMETHASONE SODIUM PHOSPHATE 10 MG/ML IJ SOLN
10.0000 mg | Freq: Once | INTRAMUSCULAR | Status: AC
  Administered 2016-10-07: 10 mg via INTRAVENOUS
  Filled 2016-10-07: qty 1

## 2016-10-07 MED ORDER — PROCHLORPERAZINE EDISYLATE 5 MG/ML IJ SOLN
10.0000 mg | Freq: Once | INTRAMUSCULAR | Status: AC
Start: 2016-10-07 — End: 2016-10-07
  Administered 2016-10-07: 10 mg via INTRAVENOUS
  Filled 2016-10-07: qty 2

## 2016-10-07 MED ORDER — DIPHENHYDRAMINE HCL 50 MG/ML IJ SOLN
25.0000 mg | Freq: Once | INTRAMUSCULAR | Status: AC
Start: 2016-10-07 — End: 2016-10-07
  Administered 2016-10-07: 25 mg via INTRAVENOUS
  Filled 2016-10-07: qty 1

## 2016-10-07 MED ORDER — PROMETHAZINE HCL 25 MG PO TABS: 25.0000 mg | ORAL_TABLET | Freq: Four times a day (QID) | ORAL | 0 refills | Status: DC | PRN

## 2016-10-07 MED ORDER — SODIUM CHLORIDE 0.9 % IV BOLUS (SEPSIS)
1000.0000 mL | Freq: Once | INTRAVENOUS | Status: AC
  Administered 2016-10-07: 1000 mL via INTRAVENOUS

## 2016-10-07 NOTE — ED Provider Notes (Signed)
[facility]-EMERGENCY DEPT Provider Note   CSN: [id number] Arrival date & time: 10/06/16  2140   By signing my name below, I, [name], attest that this documentation has been prepared under the direction and in the presence of Gilda Crease, MD  Electronically Signed: [name], ED Scribe. 10/07/16. 1:36 AM.   History   Chief Complaint Chief Complaint  Patient presents with  . Headache  . Nausea  . Asthma   The history is provided by the patient. No language interpreter was used.   HPI Comments:  Alice Price is a 21 y.o. female, with a hx of asthma and seizures, who presents to the Emergency Department complaining of sudden onset, persistent left sided headache which radiates behind her bilateral eyes x 4 days. Pt states she had 2 episodes of vomiting x yesterday, 2 episodes of LOC x yesterday, current chest heaviness like something is sitting on her chest and diarrhea. Her chest discomfort is worse with deep breaths. She reports photophobia and lightheadedness, even when she tries to get out of bed. No alleviating factors noted. Pt denies any other associated symptoms and any other modifying factors at this time.    Past Medical History:  Diagnosis Date  . Allergy   . Anemia   . Anxiety   . Asthma   . Seizures (HCC)    pseudoseizures    There are no active problems to display for this patient.   Past Surgical History:  Procedure Laterality Date  . APPENDECTOMY    . ovarien    . WISDOM TOOTH EXTRACTION      OB History    Gravida Para Term Preterm AB Living   2       1 0   SAB TAB Ectopic Multiple Live Births                   Home Medications    Prior to Admission medications   Medication Sig Start Date End Date Taking? Authorizing Provider  ibuprofen (ADVIL,MOTRIN) 800 MG tablet Take 800 mg by mouth every 8 (eight) hours as needed for moderate pain.    Yes Historical Provider, MD  penicillin v potassium (VEETID) 500 MG tablet Take 500 mg by mouth 3  (three) times daily.   Yes Historical Provider, MD  albuterol (PROVENTIL HFA;VENTOLIN HFA) 108 (90 Base) MCG/ACT inhaler Inhale 2 puffs into the lungs every 6 (six) hours as needed for wheezing or shortness of breath. 10/07/16   Gilda Crease, MD  promethazine (PHENERGAN) 25 MG tablet Take 1 tablet (25 mg total) by mouth every 6 (six) hours as needed for nausea or vomiting. 10/07/16   Gilda Crease, MD    Family History Family History  Problem Relation Age of Onset  . Mental illness Mother   . Hyperlipidemia Father   . Hypertension Father     Social History Social History  Substance Use Topics  . Smoking status: Former Smoker    Quit date: 07/01/2016  . Smokeless tobacco: Never Used  . Alcohol use Yes     Comment: occasionally     Allergies   Coconut flavor [flavoring agent]   Review of Systems Review of Systems  Eyes: Positive for photophobia.  Cardiovascular: Positive for chest pain.  Gastrointestinal: Positive for nausea and vomiting.  Neurological: Positive for syncope, light-headedness and headaches.  All other systems reviewed and are negative.  Physical Exam Updated Vital Signs BP 121/76 (BP Location: Left Arm)   Pulse 119  Temp 98.6 F (37 C) (Oral)   Resp 18   LMP 10/01/2016   SpO2 98%   Breastfeeding? Unknown   Physical Exam  Constitutional: She is oriented to person, place, and time. She appears well-developed and well-nourished. No distress.  HENT:  Head: Normocephalic and atraumatic.  Right Ear: Hearing normal.  Left Ear: Hearing normal.  Nose: Nose normal.  Mouth/Throat: Oropharynx is clear and moist and mucous membranes are normal.  Eyes: Conjunctivae and EOM are normal. Pupils are equal, round, and reactive to light.  Neck: Normal range of motion. Neck supple.  Cardiovascular: Regular rhythm, S1 normal and S2 normal.  Exam reveals no gallop and no friction rub.   No murmur heard. Pulmonary/Chest: Effort normal and breath sounds  normal. No respiratory distress. She exhibits no tenderness.  Abdominal: Soft. Normal appearance and bowel sounds are normal. There is no hepatosplenomegaly. There is no tenderness. There is no rebound, no guarding, no tenderness at McBurney's point and negative Murphy's sign. No hernia.  Musculoskeletal: Normal range of motion.  Neurological: She is alert and oriented to person, place, and time. She has normal strength. No cranial nerve deficit or sensory deficit. Coordination normal. GCS eye subscore is 4. GCS verbal subscore is 5. GCS motor subscore is 6.  Skin: Skin is warm, dry and intact. No rash noted. No cyanosis.  Psychiatric: She has a normal mood and affect. Her speech is normal and behavior is normal. Thought content normal.  Nursing note and vitals reviewed.    ED Treatments / Results  DIAGNOSTIC STUDIES:  Oxygen Saturation is 98% on RA, normal by my interpretation.    COORDINATION OF CARE:  1:35 AM Discussed treatment plan with pt at bedside and pt agreed to plan.  Labs (all labs ordered are listed, but only abnormal results are displayed) Labs Reviewed  BASIC METABOLIC PANEL - Abnormal; Notable for the following:       Result Value   Potassium 3.3 (*)    Glucose, Bld 121 (*)    All other components within normal limits  CBC  I-STAT BETA HCG BLOOD, ED ([facility], [facility], [facility] ONLY)    EKG  EKG Interpretation  Date/Time:  Monday October 06 2016 22:32:27 EST Ventricular Rate:  122 PR Interval:  118 QRS Duration: 72 QT Interval:  236 QTC Calculation: 336 R Axis:   65 Text Interpretation:  Sinus tachycardia Biatrial enlargement ST & T wave abnormality, consider inferolateral ischemia Abnormal ECG Confirmed by Blinda Leatherwood  MD, CHRISTOPHER [phone]) on 10/07/2016 1:29:53 AM       Radiology Ct Head Wo Contrast  Result Date: 10/07/2016 CLINICAL DATA:  Acute onset of headache.  Initial encounter. EXAM: CT HEAD WITHOUT CONTRAST TECHNIQUE: Contiguous axial images were obtained from the  base of the skull through the vertex without intravenous contrast. COMPARISON:  None. FINDINGS: Brain: No evidence of acute infarction, hemorrhage, hydrocephalus, extra-axial collection or mass lesion/mass effect. The posterior fossa, including the cerebellum, brainstem and fourth ventricle, is within normal limits. The third and lateral ventricles, and basal ganglia are unremarkable in appearance. The cerebral hemispheres are symmetric in appearance, with normal gray-white differentiation. No mass effect or midline shift is seen. Vascular: No hyperdense vessel or unexpected calcification. Skull: There is no evidence of fracture; visualized osseous structures are unremarkable in appearance. Sinuses/Orbits: The visualized portions of the orbits are within normal limits. The paranasal sinuses and mastoid air cells are well-aerated. Other: No significant soft tissue abnormalities are seen. IMPRESSION: Unremarkable noncontrast CT of the head.  Electronically Signed   By: Roanna RaiderJeffery  Chang M.D.   On: 10/07/2016 02:25    Procedures Procedures (including critical care time)  Medications Ordered in ED Medications  albuterol (PROVENTIL) (2.5 MG/3ML) 0.083% nebulizer solution (  Canceled Entry 10/06/16 2329)  albuterol (PROVENTIL) (2.5 MG/3ML) 0.083% nebulizer solution 5 mg (5 mg Nebulization Given 10/06/16 2207)  sodium chloride 0.9 % bolus 1,000 mL (1,000 mLs Intravenous New Bag/Given 10/07/16 0245)  prochlorperazine (COMPAZINE) injection 10 mg (10 mg Intravenous Given 10/07/16 0245)  diphenhydrAMINE (BENADRYL) injection 25 mg (25 mg Intravenous Given 10/07/16 0245)  dexamethasone (DECADRON) injection 10 mg (10 mg Intravenous Given 10/07/16 0245)     Initial Impression / Assessment and Plan / ED Course  I have reviewed the triage vital signs and the nursing notes.  Pertinent labs & imaging results that were available during my care of the patient were reviewed by me and considered in my medical decision making (see  chart for details).  Clinical Course    Patient presents to the emergency department for evaluation of headache. Pain is primarily on the left side of her head, around the left eye but and radiates backwards. She does have a history of migraines, but the headache seems to feel worse than her previous migraines. This has been ongoing for 4 days. Today she had nausea and vomiting followed by syncope. CT head was performed because of unusual headache, no acute abnormality noted. Patient is felt to be very low risk for cerebral aneurysm and subarachnoid hemorrhage. She was treated with migraine cocktail and had resolution of her headache. I therefore do not feel she requires lumbar puncture for further workup.  Patient did have some sensation of tightness in her chest. She does have a history of asthma. She does not have an inhaler. She appears very anxious on arrival. I believe this is causing some of her tightness sensation. Lungs are clear. Oxygenation is normal. She did have mild tachycardia during periods of anxiety, but otherwise normal heart rate. I believe she is very low risk for pulmonary embolism. Patient reassured, appropriate for discharge.  Final Clinical Impressions(s) / ED Diagnoses   Final diagnoses:  Other migraine without status migrainosus, not intractable  Panic attack    New Prescriptions New Prescriptions   PROMETHAZINE (PHENERGAN) 25 MG TABLET    Take 1 tablet (25 mg total) by mouth every 6 (six) hours as needed for nausea or vomiting.  I personally performed the services described in this documentation, which was scribed in my presence. The recorded information has been reviewed and is accurate.     Gilda Creasehristopher J Pollina, MD 10/07/16 [id number]

## 2016-10-21 ENCOUNTER — Ambulatory Visit (INDEPENDENT_AMBULATORY_CARE_PROVIDER_SITE_OTHER): Payer: Medicaid Other

## 2016-10-21 ENCOUNTER — Encounter (HOSPITAL_COMMUNITY): Payer: Self-pay | Admitting: Emergency Medicine

## 2016-10-21 ENCOUNTER — Ambulatory Visit (HOSPITAL_COMMUNITY)
Admission: EM | Admit: 2016-10-21 | Discharge: 2016-10-21 | Disposition: A | Discharge status: Home / Self Care | Payer: Medicaid Other | Attending: Family Medicine | Admitting: Family Medicine

## 2016-10-21 DIAGNOSIS — S6992XA Unspecified injury of left wrist, hand and finger(s), initial encounter: Secondary | ICD-10-CM | POA: Diagnosis not present

## 2016-10-21 MED ORDER — DICLOFENAC SODIUM 75 MG PO TBEC: 75.0000 mg | DELAYED_RELEASE_TABLET | Freq: Two times a day (BID) | ORAL | 0 refills | Status: DC
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
## 2016-10-21 NOTE — Discharge Instructions (Signed)
The xray of your hand showed no acute fractures, dislocations, or other acute findings, however, you do exhibit tenderness when the scaphoid bone is percussed (tapping at the anatomical snuff box). As we discussed this can be a concerning sign and fractures can be difficult to visualize within this bone. Therefor, I am splinting your wrist and advising you to schedule a follow up appointment with your primary care provider in 1 week to reevaluation your symptoms. I have prescribed a medicine for pain called diclofenac, take 1 tablet twice a day. You may also use Tylenol as well for additional pain control if needed.  Should your symptoms fail to improve or worsen, follow up with your primary care provider, an orthopedic doctor, or return to clinic as needed.
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
## 2016-10-22 ENCOUNTER — Ambulatory Visit (HOSPITAL_COMMUNITY)
Admission: EM | Admit: 2016-10-22 | Discharge: 2016-10-22 | Disposition: A | Discharge status: Home / Self Care | Payer: Medicaid Other | Attending: Internal Medicine | Admitting: Internal Medicine

## 2016-10-22 ENCOUNTER — Encounter (HOSPITAL_COMMUNITY): Payer: Self-pay | Admitting: Emergency Medicine

## 2016-10-22 DIAGNOSIS — M25532 Pain in left wrist: Secondary | ICD-10-CM

## 2016-10-22 MED ORDER — HYDROCODONE-ACETAMINOPHEN 5-325 MG PO TABS: 1.0000 | ORAL_TABLET | Freq: Four times a day (QID) | ORAL | 0 refills | Status: AC | PRN

## 2016-10-22 NOTE — Discharge Instructions (Signed)
I have written a prescription for hydrocodone 5 mg, acetaminophin 325 mg, take 1-2 tablets every 6 hours as needed for pain for 3 days. I would encourage you to continue to take the diclofenac I prescribed yesterday as this will help the inflammation in your wrist that is the cause of your pain. I would also encourage you to schedule a follow up appointment with your primary care provider for further evaluation and management of your injury. This medication I have written for is a narcotic and it is addictive. Do not take more than 3 days, do not drink any alcohol while taking this medicine, and do not drive or operate machinery while taking this medicine
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
## 2016-10-22 NOTE — ED Triage Notes (Signed)
Seen yesterday at [facility].  Today pain in left wrist has worsened and numbness in left index finger.  No additional injury

## 2016-11-14 ENCOUNTER — Encounter (HOSPITAL_COMMUNITY): Payer: Self-pay | Admitting: Family Medicine

## 2016-11-14 ENCOUNTER — Ambulatory Visit (HOSPITAL_COMMUNITY)
Admission: EM | Admit: 2016-11-14 | Discharge: 2016-11-14 | Disposition: A | Discharge status: Home / Self Care | Payer: Medicaid Other | Attending: Family Medicine | Admitting: Family Medicine

## 2016-11-14 DIAGNOSIS — B349 Viral infection, unspecified: Secondary | ICD-10-CM

## 2016-11-14 LAB — POCT PREGNANCY, URINE: PREG TEST UR: NEGATIVE

## 2016-11-14 MED ORDER — ONDANSETRON 8 MG PO TBDP: 8.0000 mg | ORAL_TABLET | Freq: Three times a day (TID) | ORAL | 0 refills | Status: AC | PRN

## 2016-11-14 NOTE — ED Provider Notes (Signed)
[facility]    CSN: [id number] Arrival date & time: 11/14/16  1857     History   Chief Complaint Chief Complaint  Patient presents with  . Generalized Body Aches    HPI Alice Price is a 21 y.o. female.   Is a 21 year old woman who works at the [employer]. She's been having intermittent nausea and persistent muscle aches in her back all week. Her nausea got worse yesterday and she did vomit. She has not vomited today. She has eaten at the local [employer] but her stomach does not feel healthy.  Patient denies abdominal pain, abnormal vaginal bleeding, or fever this afternoon. She is have a mild fever this morning. At that time she did take Tylenol.      Past Medical History:  Diagnosis Date  . Allergy   . Anemia   . Anxiety   . Asthma   . Seizures (HCC)    pseudoseizures    There are no active problems to display for this patient.   Past Surgical History:  Procedure Laterality Date  . APPENDECTOMY    . ovarien    . WISDOM TOOTH EXTRACTION      OB History    Gravida Para Term Preterm AB Living   2       1 0   SAB TAB Ectopic Multiple Live Births                   Home Medications    Prior to Admission medications   Medication Sig Start Date End Date Taking? Authorizing Provider  ondansetron (ZOFRAN-ODT) 8 MG disintegrating tablet Take 1 tablet (8 mg total) by mouth every 8 (eight) hours as needed for nausea. 11/14/16   Elvina Sidle, MD    Family History Family History  Problem Relation Age of Onset  . Mental illness Mother   . Hyperlipidemia Father   . Hypertension Father     Social History Social History  Substance Use Topics  . Smoking status: Former Smoker    Quit date: 07/01/2016  . Smokeless tobacco: Never Used  . Alcohol use No     Allergies   Coconut flavor [flavoring agent]   Review of Systems Review of Systems  Constitutional: Positive for fatigue and fever.  HENT: Negative.   Eyes: Negative.     Respiratory: Negative.   Gastrointestinal: Positive for nausea and vomiting.  Genitourinary: Negative.   Musculoskeletal: Positive for back pain and myalgias.  Neurological: Negative.      Physical Exam Triage Vital Signs ED Triage Vitals  Enc Vitals Group     BP 11/14/16 1953 111/76     Pulse Rate 11/14/16 1953 82     Resp 11/14/16 1953 16     Temp 11/14/16 1953 98.3 F (36.8 C)     Temp src --      SpO2 11/14/16 1953 100 %     Weight --      Height --      Head Circumference --      Peak Flow --      Pain Score 11/14/16 1958 3     Pain Loc --      Pain Edu? --      Excl. in GC? --    No data found.   Updated Vital Signs BP 111/76 (BP Location: Left Arm)   Pulse 82   Temp 98.3 F (36.8 C)   Resp 16   LMP 10/20/2016   SpO2 100%  Physical Exam  Constitutional: She is oriented to person, place, and time. She appears well-developed and well-nourished.  HENT:  Head: Normocephalic.  Right Ear: External ear normal.  Left Ear: External ear normal.  Mouth/Throat: Oropharynx is clear and moist.  Eyes: Conjunctivae and EOM are normal. Pupils are equal, round, and reactive to light.  Neck: Normal range of motion. Neck supple.  Cardiovascular: Normal rate, regular rhythm and normal heart sounds.   Pulmonary/Chest: Effort normal and breath sounds normal.  Abdominal: Soft. Bowel sounds are normal. She exhibits no distension. There is no rebound and no guarding.  Musculoskeletal: Normal range of motion.  Neurological: She is alert and oriented to person, place, and time.  Skin: Skin is warm and dry.  Nursing note and vitals reviewed.    UC Treatments / Results  Labs (all labs ordered are listed, but only abnormal results are displayed) .this Results for orders placed or performed during the hospital encounter of 11/14/16  Pregnancy, urine POC  Result Value Ref Range   Preg Test, Ur NEGATIVE NEGATIVE    EKG  EKG Interpretation None       Radiology No  results found.  Procedures Procedures (including critical care time)  Medications Ordered in UC Medications - No data to display   Initial Impression / Assessment and Plan / UC Course  I have reviewed the triage vital signs and the nursing notes.  Pertinent labs & imaging results that were available during my care of the patient were reviewed by me and considered in my medical decision making (see chart for details).     Final Clinical Impressions(s) / UC Diagnoses   Final diagnoses:  Viral syndrome    New Prescriptions New Prescriptions   ONDANSETRON (ZOFRAN-ODT) 8 MG DISINTEGRATING TABLET    Take 1 tablet (8 mg total) by mouth every 8 (eight) hours as needed for nausea.     Elvina SidleKurt Cassandra Mcmanaman, MD 11/14/16 2015

## 2016-11-14 NOTE — Discharge Instructions (Signed)
All your symptoms and signs point to a viral illness. I have prescribed for you medicine to make you more comfortable. I'm keeping her out of work for another 2 days to get over this. Tried to drink clear fluids as much as possible.  Please return if you have new symptoms or you're getting more ill.

## 2016-11-14 NOTE — ED Triage Notes (Signed)
Body  Aches   Vomited  Last  Pm    Some  Diarrhea         Feels  Nauseated       No  sorethtroat

## 2018-04-25 IMAGING — US US OB TRANSVAGINAL
1 series · 15 of 28 positions shown · non-contrast
Comparison: 07/30/2016

CLINICAL DATA: Missed abortion. Pain. Increased vaginal bleeding.
Quantitative beta HCG [DATE]. Estimated gestational age by LMP is 9
weeks 3 days.

EXAM:
TRANSVAGINAL OB ULTRASOUND
TECHNIQUE: Transvaginal ultrasound was performed for complete evaluation of the
gestation as well as the maternal uterus, adnexal regions, and
pelvic cul-de-sac.

[Series 1: us ob transvaginal · 15 of 37 slices shown]
[im 1/37]
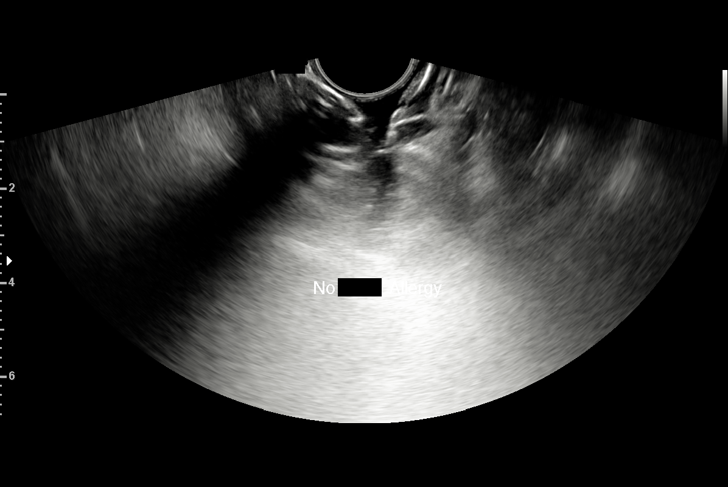
[im 3/37]
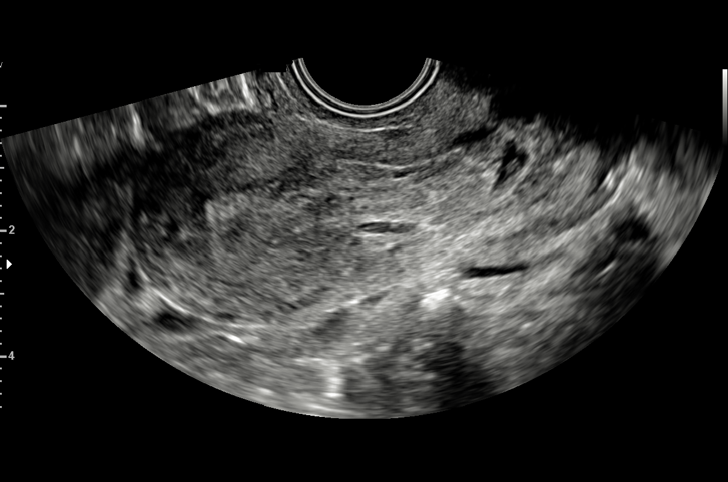
[im 6/37]
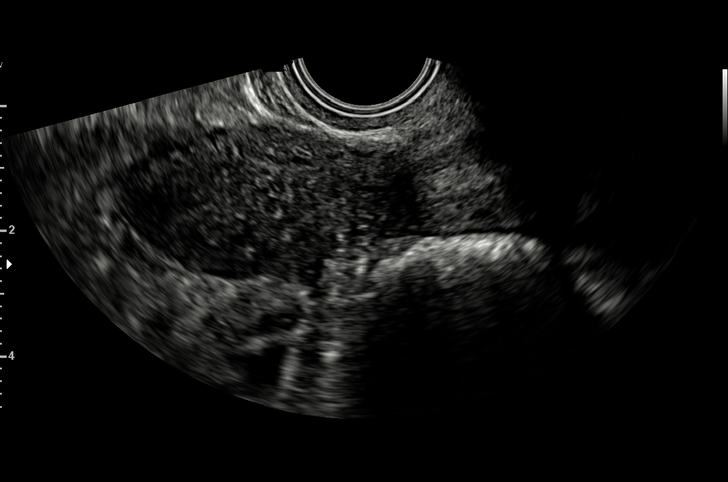
[im 9/37]
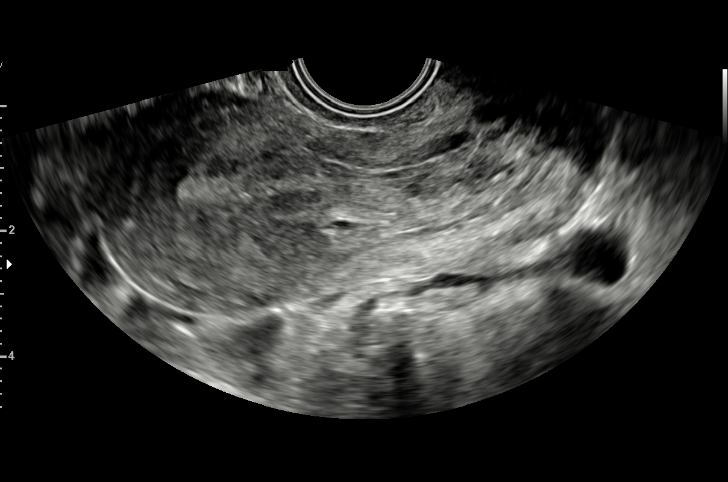
[im 11/37]
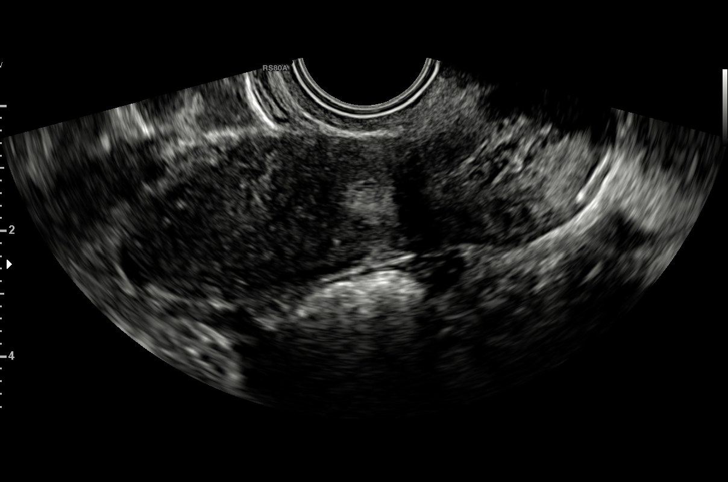
[im 14/37]
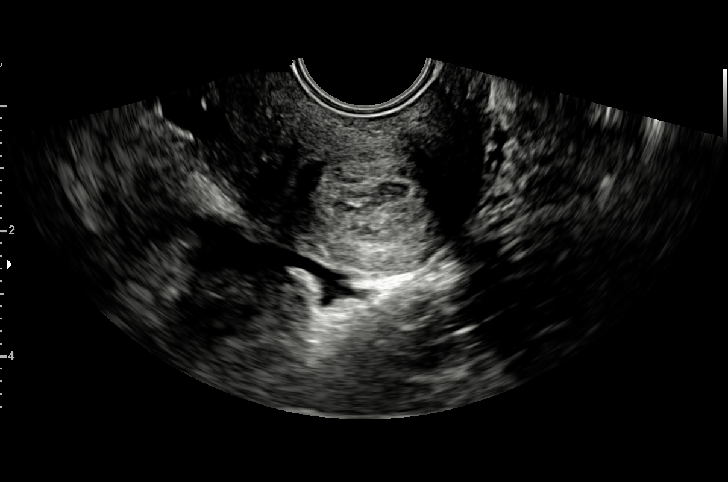
[im 17/37]
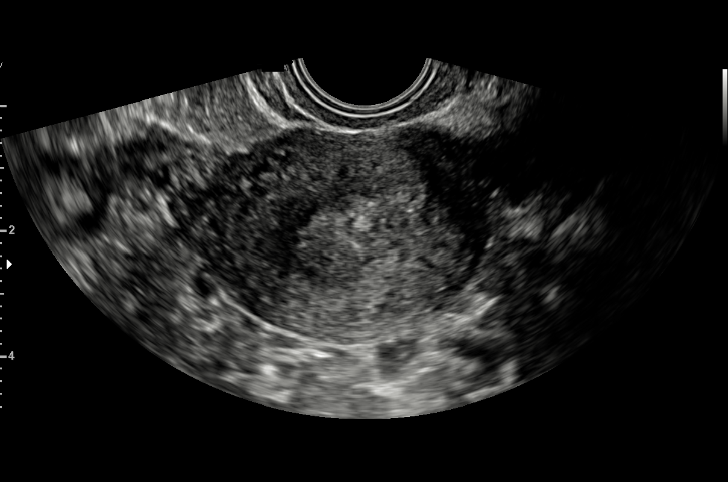
[im 19/37]
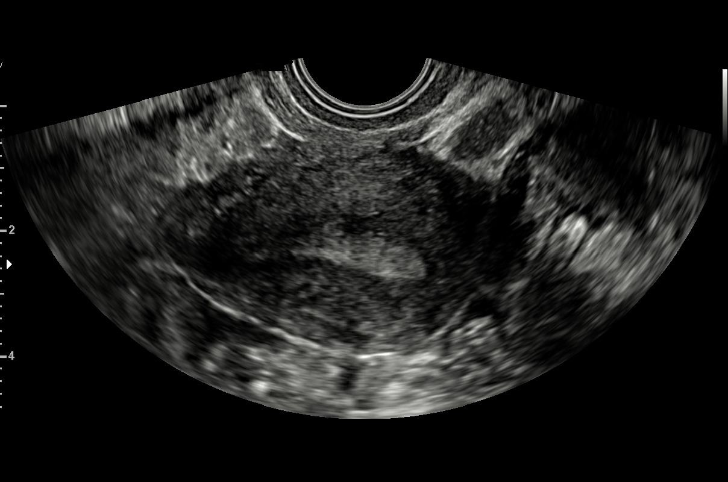
[im 21/37]
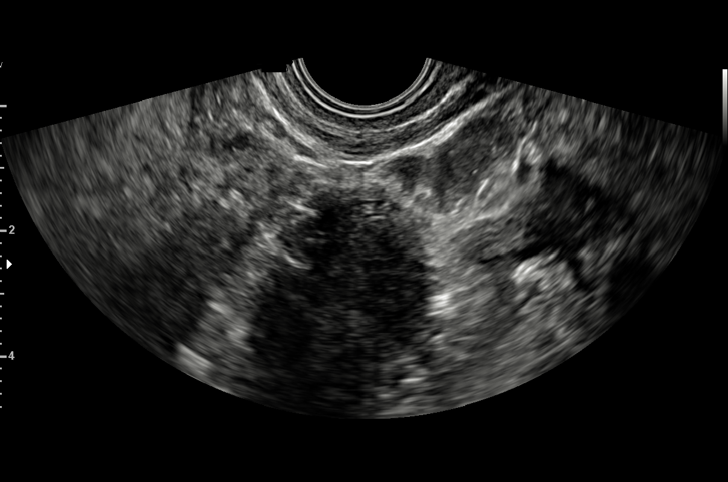
[im 23/37]
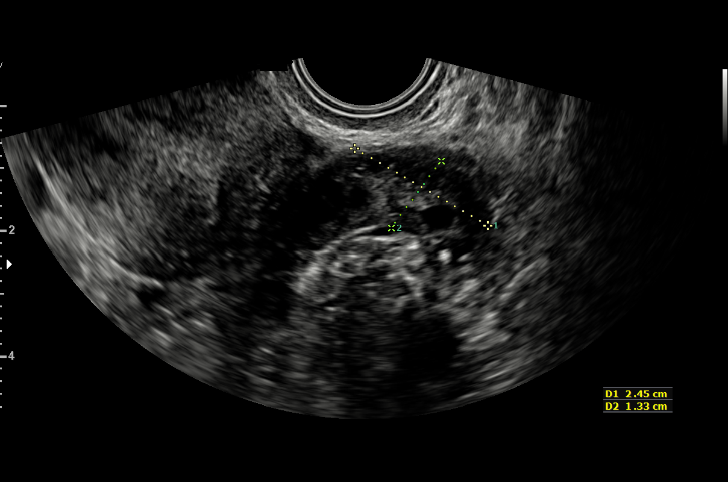
[im 26/37]
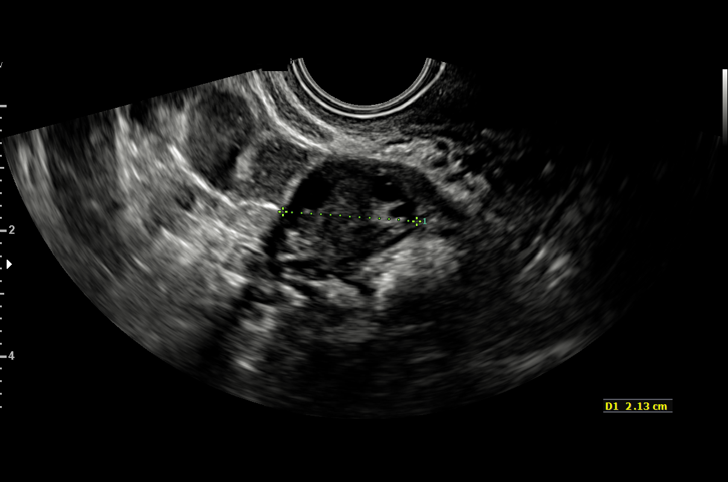
[im 29/37]
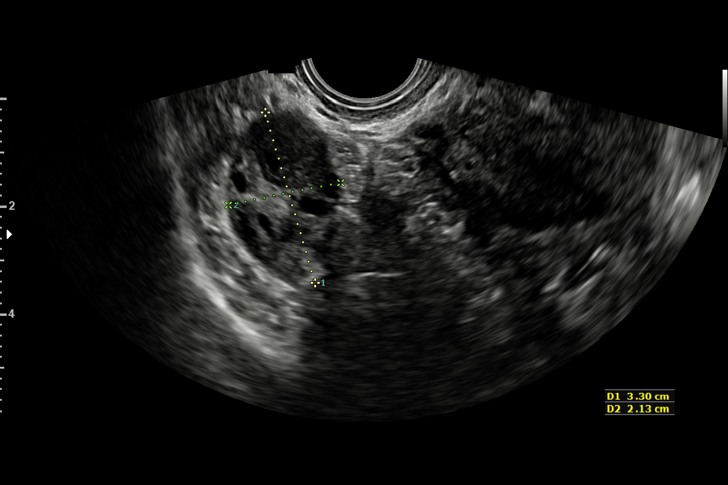
[im 31/37]
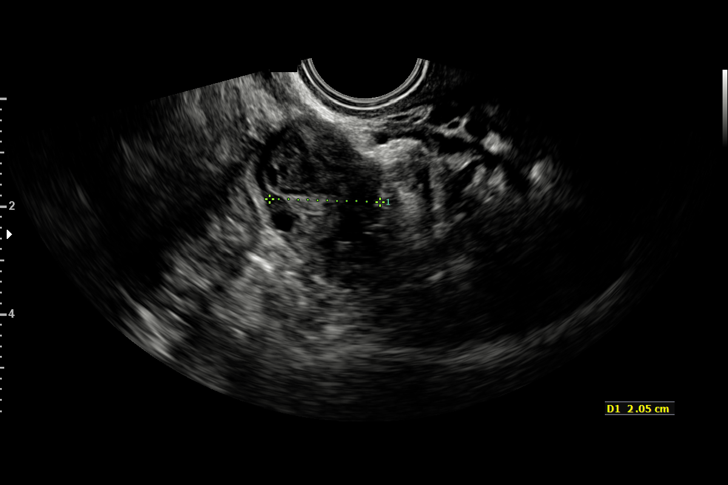
[im 34/37]
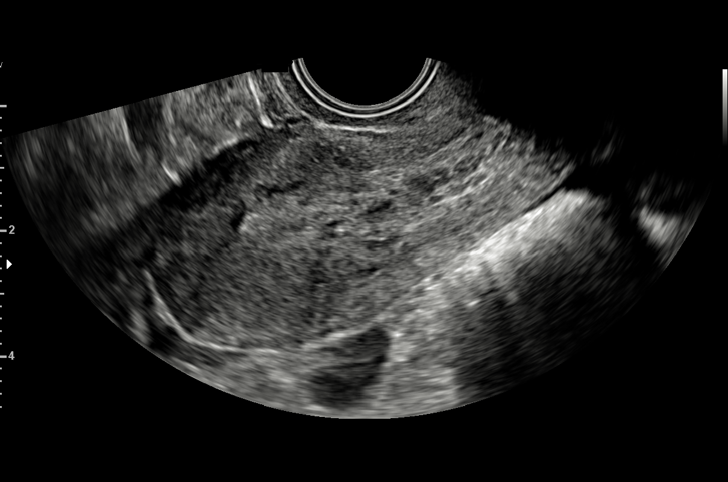
[im 37/37]
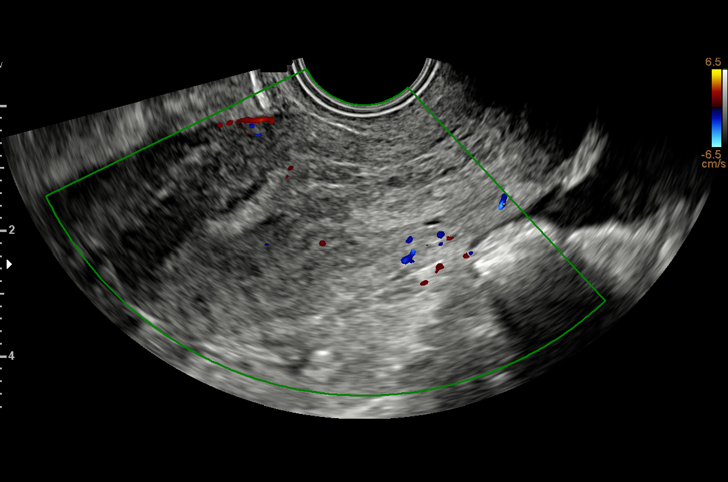

[15 of 28 positions shown; findings below may reference images not displayed]

FINDINGS: Intrauterine gestational sac: No intrauterine gestational sac
identified.

Yolk sac:  Not identified.

Embryo:  Not identified.

Cardiac Activity: None identified.

Maternal uterus/adnexae: No myometrial mass lesions identified.
Endometrium appears thickened and heterogeneous with mixed
echotexture material within the endometrium. Endometrial thickness
measures about 1.2 cm. No flow is demonstrated within the
endometrial contents on color flow Doppler imaging. Both ovaries are
visualized and appear normal. No abnormal adnexal masses. Small
amount of free fluid in the pelvis.
IMPRESSION: No intrauterine gestational sac or fetal pole seen today, indicating
interval loss of the pregnancy seen on previous study. Blood clots
demonstrated within the endometrium without evidence of retained
products of conception. Findings meet definitive criteria for failed
pregnancy. This follows SRU consensus guidelines: Diagnostic
Criteria for Nonviable Pregnancy Early in the First Trimester. N
Engl J Med 1285;[DATE].
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
